# Patient Record
Sex: Female | Born: 1950 | Race: White | Hispanic: No | Marital: Married | Smoking: Former smoker
Health system: Southern US, Community
[De-identification: ages and names within clinical notes are randomized; demographics above are authoritative.]

## PROBLEM LIST (undated history)

## (undated) DIAGNOSIS — R0602 Shortness of breath: Secondary | ICD-10-CM

## (undated) DIAGNOSIS — K2960 Other gastritis without bleeding: Secondary | ICD-10-CM

## (undated) DIAGNOSIS — L03317 Cellulitis of buttock: Secondary | ICD-10-CM

## (undated) DIAGNOSIS — E039 Hypothyroidism, unspecified: Secondary | ICD-10-CM

## (undated) DIAGNOSIS — R000 Tachycardia, unspecified: Secondary | ICD-10-CM

## (undated) DIAGNOSIS — I499 Cardiac arrhythmia, unspecified: Secondary | ICD-10-CM

## (undated) DIAGNOSIS — I272 Pulmonary hypertension, unspecified: Secondary | ICD-10-CM

## (undated) DIAGNOSIS — J449 Chronic obstructive pulmonary disease, unspecified: Secondary | ICD-10-CM

## (undated) DIAGNOSIS — J154 Pneumonia due to other streptococci: Secondary | ICD-10-CM

## (undated) DIAGNOSIS — R0789 Other chest pain: Secondary | ICD-10-CM

## (undated) DIAGNOSIS — I739 Peripheral vascular disease, unspecified: Secondary | ICD-10-CM

## (undated) HISTORY — DX: Pneumonia due to other streptococci: J15.4

## (undated) HISTORY — PX: PARTIAL HYSTERECTOMY: SHX80

## (undated) HISTORY — PX: THUMB ARTHROSCOPY: SHX2509

## (undated) HISTORY — DX: Cellulitis of buttock: L03.317

## (undated) HISTORY — DX: Tachycardia, unspecified: R00.0

## (undated) HISTORY — DX: Other gastritis without bleeding: K29.60

## (undated) HISTORY — PX: CHOLECYSTECTOMY: SHX55

## (undated) HISTORY — DX: Other chest pain: R07.89

## (undated) HISTORY — DX: Pulmonary hypertension, unspecified: I27.20

## (undated) HISTORY — PX: APPENDECTOMY: SHX54

## (undated) HISTORY — DX: Peripheral vascular disease, unspecified: I73.9

---

## 2005-01-17 ENCOUNTER — Ambulatory Visit: Payer: Self-pay | Admitting: Internal Medicine

## 2005-01-17 ENCOUNTER — Ambulatory Visit (HOSPITAL_COMMUNITY): Admission: RE | Admit: 2005-01-17 | Discharge: 2005-01-17 | Payer: Self-pay | Admitting: Internal Medicine

## 2005-01-17 ENCOUNTER — Encounter: Payer: Self-pay | Admitting: Internal Medicine

## 2005-02-09 ENCOUNTER — Encounter: Admission: RE | Admit: 2005-02-09 | Discharge: 2005-02-09 | Payer: Self-pay | Admitting: Rheumatology

## 2005-02-21 ENCOUNTER — Ambulatory Visit (HOSPITAL_COMMUNITY): Admission: RE | Admit: 2005-02-21 | Discharge: 2005-02-21 | Payer: Self-pay | Admitting: Internal Medicine

## 2006-08-19 ENCOUNTER — Encounter: Payer: Self-pay | Admitting: Cardiology

## 2007-10-23 ENCOUNTER — Encounter: Payer: Self-pay | Admitting: Cardiology

## 2007-10-31 ENCOUNTER — Ambulatory Visit: Payer: Self-pay | Admitting: Cardiology

## 2007-11-06 ENCOUNTER — Ambulatory Visit: Payer: Self-pay | Admitting: Cardiology

## 2007-11-24 ENCOUNTER — Encounter: Admission: RE | Admit: 2007-11-24 | Discharge: 2007-11-24 | Payer: Self-pay | Admitting: Sports Medicine

## 2007-12-02 ENCOUNTER — Ambulatory Visit: Payer: Self-pay | Admitting: Cardiology

## 2007-12-08 ENCOUNTER — Ambulatory Visit: Payer: Self-pay | Admitting: Cardiology

## 2007-12-16 ENCOUNTER — Encounter: Payer: Self-pay | Admitting: Cardiology

## 2008-01-05 ENCOUNTER — Ambulatory Visit: Payer: Self-pay | Admitting: Cardiology

## 2008-10-06 ENCOUNTER — Encounter: Payer: Self-pay | Admitting: Cardiology

## 2008-10-07 ENCOUNTER — Encounter: Payer: Self-pay | Admitting: Cardiology

## 2008-10-08 ENCOUNTER — Encounter: Payer: Self-pay | Admitting: Cardiology

## 2008-10-14 ENCOUNTER — Encounter: Payer: Self-pay | Admitting: Cardiology

## 2008-11-12 ENCOUNTER — Encounter: Payer: Self-pay | Admitting: Cardiology

## 2008-11-24 ENCOUNTER — Encounter: Payer: Self-pay | Admitting: Physician Assistant

## 2008-11-24 ENCOUNTER — Ambulatory Visit: Payer: Self-pay | Admitting: Cardiology

## 2008-11-30 ENCOUNTER — Encounter: Payer: Self-pay | Admitting: Physician Assistant

## 2008-12-24 ENCOUNTER — Encounter (INDEPENDENT_AMBULATORY_CARE_PROVIDER_SITE_OTHER): Payer: Self-pay | Admitting: *Deleted

## 2009-02-02 DIAGNOSIS — R079 Chest pain, unspecified: Secondary | ICD-10-CM

## 2009-02-02 DIAGNOSIS — I959 Hypotension, unspecified: Secondary | ICD-10-CM

## 2009-02-02 DIAGNOSIS — R0989 Other specified symptoms and signs involving the circulatory and respiratory systems: Secondary | ICD-10-CM | POA: Insufficient documentation

## 2009-04-07 ENCOUNTER — Encounter: Payer: Self-pay | Admitting: Physician Assistant

## 2009-04-07 ENCOUNTER — Encounter: Payer: Self-pay | Admitting: Cardiology

## 2009-04-07 ENCOUNTER — Ambulatory Visit: Payer: Self-pay | Admitting: Cardiology

## 2009-04-08 ENCOUNTER — Encounter: Payer: Self-pay | Admitting: Cardiology

## 2009-04-09 ENCOUNTER — Encounter: Payer: Self-pay | Admitting: Cardiology

## 2009-05-12 ENCOUNTER — Encounter: Payer: Self-pay | Admitting: Cardiology

## 2009-05-18 ENCOUNTER — Ambulatory Visit: Payer: Self-pay | Admitting: Cardiology

## 2010-02-08 ENCOUNTER — Encounter (INDEPENDENT_AMBULATORY_CARE_PROVIDER_SITE_OTHER): Payer: Self-pay | Admitting: *Deleted

## 2010-05-23 NOTE — Letter (Signed)
Summary: [facility] D/C DR. VYAS  [facility] D/C DR. VYAS   Imported By: Zachary George 05/17/2009 14:48:39  _____________________________________________________________________  External Attachment:    Type:   Image     Comment:   External Document

## 2010-05-23 NOTE — Letter (Signed)
Summary: Recall, Screening Colonoscopy Only  [facility]  [address]   [place], [place] [address]   Phone: [phone]  Fax: [phone]    February 08, 2010  [facility] [address] [place], [place]  [address] 06-27-1950   Dear Ms. Prestia,   Our records indicate it is time to schedule your colonoscopy.    Please call our office at [phone] and ask for the nurse.   Thank you,    Hendricks Limes, LPN Cloria Spring, LPN  [facility] Ph: [phone]   Fax: [phone]

## 2010-05-23 NOTE — Assessment & Plan Note (Signed)
Summary: 6 month fu-recv letter vs   Visit Type:  Follow-up Primary Provider:  Doreen Beam, MD   History of Present Illness: The patient is seen for followup of tingling in her arms and tachycardia.  She was recently hospitalized and we saw her in consultation.  It was felt that her discomfort was not typical for cardiac pain.  She has severe COPD.  It was felt that a 2-D echo should be done.  The echo shows ejection fraction exceeding 65%.  She is now seen back for follow.  Preventive Screening-Counseling & Management  Alcohol-Tobacco     Smoking Status: quit > 6 months     Year Quit: 2010  Comments: Pt smoked off/on since about 60 yrs old. She quit in June 2010.  Current Medications (verified): 1)  Toprol Xl 25 Mg Xr24h-Tab (Metoprolol Succinate) .... Take 1 Tablet By Mouth Once A Day 2)  Cymbalta 60 Mg Cpep (Duloxetine Hcl) .... Take 1 Tablet By Mouth Once A Day 3)  Omeprazole 20 Mg Cpdr (Omeprazole) .... Take 1 Tablet By Mouth Once A Day 4)  Spiriva Handihaler 18 Mcg Caps (Tiotropium Bromide Monohydrate) .... Inhale As Directed Once Daily 5)  Advair Diskus 250-50 Mcg/dose Aepb (Fluticasone-Salmeterol) .... Inhale Two Times A Day As Directed 6)  Singulair 10 Mg Tabs (Montelukast Sodium) .... Take 1 Tablet By Mouth Once A Day 7)  Duoneb 0.5-2.5 (3) Mg/23ml Soln (Ipratropium-Albuterol) .... As Needed 8)  Xanax 0.5 Mg Tabs (Alprazolam) .... Take As Directed As Needed 9)  Vitamin D3 1000 Unit Caps (Cholecalciferol) .... Take 1 Capsule By Mouth Once A Day 10)  B Complex-B12  Tabs (B Complex Vitamins) .... Take 1 Tablet By Mouth Once A Day  Allergies: 1)  Amoxicillin (Amoxicillin) 2)  Allegra (Fexofenadine Hcl) 3)  Betadine (Povidone-Iodine) 4)  Cipro (Ciprofloxacin) 5)  Claritin (Loratadine) 6)  Darvocet-N 100 (Propoxyphene N-Apap) 7)  Sulfamethoxazole (Sulfamethoxazole) 8)  Zyrtec Childrens Hives Relief (Cetirizine Hcl) 9)  Percocet  Comments:  Nurse/Medical Assistant: The  patient's medications were reviewed with the patient and were updated in the Medication List. Pt verbally confirmed medications.  Cyril Loosen, RN, BSN (May 18, 2009 1:27 PM)  Past History:  Past Medical History: Last updated: 05/18/2009 Sinus tachycardia, persistent ... history of negative Holter monitor normal LVF atypical chest pain ... low risk pharmacologic perfusion imaging study, 7/09 COPD, severe ... on supplemental oxygen ... history of tobacco relative hypotension Intermittent claudication Erosive gastritis ... EGD, 6/10 Streptococcal pneumonia Cellulitis, right buttocks Pulmonary hypertension, mild  Social History: Smoking Status:  quit > 6 months  Review of Systems       Patient denies fever, chills, headache, sweats, rash, change in vision, change in hearing, chest pain, cough, nausea vomiting, urinary symptoms.  All other systems are reviewed and are negative.  Vital Signs:  Patient profile:   60 year old female Height:      63 inches Weight:      131.75 pounds BMI:     23.42 Pulse rate:   116 / minute BP sitting:   118 / 73  (left arm) Cuff size:   regular  Vitals Entered By: Cyril Loosen, RN, BSN (May 18, 2009 1:10 PM)  Comments Pt states she saw Dr. Sherril Croon last week for increased heart rate. She said Dr. Sherril Croon seems to think that is a common thing for her.    Physical Exam  General:  patient is wearing her oxygen today. Eyes:  no xanthelasma. Neck:  no  jugular venous distention. Lungs:  lungs reveal distant breath sounds. Heart:  cardiac exam reveals S1-S2.  She does have a persistent mild sinus tachycardia. Abdomen:  abdomen is soft. Extremities:  no peripheral edema. Psych:  patient is oriented to person time and place.  Affect is normal.   Impression & Recommendations:  Problem # 1:  TACHYCARDIA (ICD-785) The patient has persistent sinus tachycardia.  This is related to her pulmonary medications and her overall status.  She is  not a candidate for beta blocker.  No further workup.  Problem # 2:  CHEST PAIN-UNSPECIFIED (ICD-786.50)  Her updated medication list for this problem includes:    Toprol Xl 25 Mg Xr24h-tab (Metoprolol succinate) .Marland Kitchen... Take 1 tablet by mouth once a day  Orders: EKG w/ Interpretation (93000) EKG today reveals mild sinus tachycardia with no acute changes.  EKG is reviewed by me.  I believe that her arm tingling and discomfort into her neck is not cardiac in origin.  Consideration can be given to neurology evaluation.  Cardiology followup in one year.  Patient Instructions: 1)  Your physician recommends that you continue on your current medications as directed. Please refer to the Current Medication list given to you today. 2)  Your physician wants you to follow-up in: 1 year. You will receive a reminder letter in the mail about two months in advance. If you don't receive a letter, please call our office to schedule the follow-up appointment.

## 2010-05-23 NOTE — Consult Note (Signed)
Summary: CARDIOLOGY CONSULT/ [facility]  CARDIOLOGY CONSULT/ [facility]   Imported By: Zachary George 05/17/2009 14:45:03  _____________________________________________________________________  External Attachment:    Type:   Image     Comment:   External Document

## 2010-05-23 NOTE — Procedures (Signed)
Summary: Holter and Event/ [facility] PATIENT INFO  Holter and Event/ [facility] PATIENT INFO   Imported By: Dorise Hiss 05/17/2009 10:29:46  _____________________________________________________________________  External Attachment:    Type:   Image     Comment:   External Document

## 2010-05-23 NOTE — Consult Note (Signed)
Summary: PULMONARY CONSULT DR. Cherie Ouch  PULMONARY CONSULT DR. Cherie Ouch   Imported By: Zachary George 05/17/2009 14:49:16  _____________________________________________________________________  External Attachment:    Type:   Image     Comment:   External Document

## 2010-09-05 NOTE — Assessment & Plan Note (Signed)
[facility]                          [facility] NOTE   Lauren Mclaughlin, Lauren Mclaughlin                     MRN:          [id number]  DATE:10/31/2007                            DOB:          [birth year]    REFERRING PHYSICIAN:  Doreen Beam, MD   REASON FOR REFERRAL:  Chest pain.   HISTORY OF PRESENT ILLNESS:  Lauren Mclaughlin is a 60 year old female  patient who is referred to Dr. Myrtis Ser at the request of Dr. Sherril Croon secondary  to chest pain.  The patient has no history of coronary disease.  She  denies any history of hypertension, diabetes mellitus, or  hyperlipidemia.  She tells me that she apparently saw Dr. Shelva Majestic about  7-9 years ago and underwent an echocardiogram and stress test.  She  reports that these were both normal.  She has not had any type of  cardiology followup since that time.   Over the last month, the patient has noted some chest discomfort.  This  is substernal and she describes it as a tightness.  It comes on at rest,  and lasts a few seconds, and goes away on its own.  She denies any  aggravating factors.  She does note that she takes Gas-X at times which  helps.  She woke up on few occasions with these symptoms.  She denies  associated shortness of breath, nausea, or diaphoresis.  She does note  some shortness of breath with exertion.  She has had this for about the  last year.  There has been no change.  She notes NYHA class II-III  symptoms.  Of note, she does have fairly significant COPD and continues  to smoke cigarettes.   The patient recently did go to the emergency room secondary to swelling  in her anterior neck.  She did have a CT scan of her neck that revealed  a small disk protrusion at the C5-C6 level with a small amount of  extruded disk material extending caudally.   PAST MEDICAL HISTORY:  1. Anxiety.  2. COPD.      a.     PFTs in April 2008, revealed severe COPD with GOLD stage IV       with decrease in DLCO  suggesting the presence of anatomic       emphysema  3. Allergic rhinitis.      a.     She has a consult pending with an allergist at [facility].  4. 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 Benson Setting some years ago and apparently had an MRI.  5. Stress incontinence.  6. Migraine headaches.   ALLERGIES:  CIPRO, ALLEGRA, ZYRTEC, CLARITIN, DARVOCET, PENICILLIN,  SULFA.   MEDICATIONS:  1. Cymbalta 60 mg daily.  2. Spiriva 18 mcg 2 puffs a day.  3. Astelin nasal spray.  4. Singular 10 mg q.h.s.  5. Some type of bladder pill.  6. Xanax 0.5 mg 1 in the morning, 2 in the evening.  7. Multivitamin daily.  8. Calcium daily.  9. Xanax  p.r.n.  10.Aleve p.r.n.   SOCIAL HISTORY:  The patient smokes a pack of cigarettes per day and has  done so for the last 35 years.  She denies alcohol or drug abuse.  She  is married, has 2 children.  She is a housewife.   FAMILY HISTORY:  Insignificant for premature CAD.  Her mother died of  lung cancer and also had peripheral arterial disease.  Her father died  from a stroke at age 31 and he also had colon cancer.   REVIEW OF SYSTEMS:  Please see HPI.  She denies orthopnea, PND, or pedal  edema.  She denies recent syncope.  She did have an episode of post-  micturition syncope about 6 months go apparently reported by her  husband.  She did not seek followup.  She denies near syncope.  She  denies fevers, chills, cough.  She has had a sore throat.  She denies  melena, hematochezia, hematuria, dysuria.  Denies any skin or hair  changes.  Denies dysphagia or odynophagia.  Rest of review of systems  are negative.   PHYSICAL EXAMINATION:  GENERAL:  She is a well-nourished, well-developed  female, in no acute distress.  VITAL SIGNS:  Blood pressure is 129/81, pulse 133, weight 128.4 pounds.  HEENT:  Normal.  NECK:  Without JVD.  She does have some mild-to-moderate edema noted at  the base of her neck just above her  manubrium that feels cystic in  nature to palpation.  LYMPH:  Without lymphadenopathy.  ENDOCRINE:  Without thyromegaly.  CARDIAC:  Normal S1, S2, regular rate and rhythm.  No murmurs, clicks,  rubs, or gallops.  LUNGS:  Clear to auscultation bilaterally.  ABDOMEN:  Soft, nontender with normoactive bowel sounds.  No  organomegaly.  EXTREMITIES:  Without edema.  NEUROLOGIC:  He is alert and oriented x3.  Cranial nerve II through XII  grossly intact.  VASCULAR EXAM:  No carotid bruits noted bilaterally.  Dorsalis pedis and  posterior tibialis pulses are difficult to palpate bilaterally.  SKIN:  Warm and dry.   ELECTROCARDIOGRAM:  Reveals sinus tachycardia with a heart rate of 123,  normal axis, biatrial enlargement, poor r-wave progression, no ischemic  changes.   IMPRESSION:  1. Chest pain.  2. Dyspnea with exertion.  3. Chronic obstructive pulmonary disease.  4. Anxiety.  5. Degenerative disk disease.  6. Anterior neck swelling.  7. Sinus tachycardia.   PLAN:  1. Lauren Mclaughlin presents to the office today for further elevation of      chest pain.  She also has shortness of breath with exertion.  She      continues to smoke cigarettes and has significant COPD.  Her      shortness of breath can certainly be explained by her smoking      history and COPD.  However, she does present with sinus      tachycardia.  She notes that her heart rate has been elevated in      the past.  A tachycardia-induced cardiomyopathy should be ruled      out.  She has few risk factors for coronary disease, but does      continue to smoke cigarettes and is over 50.  Therefore, coronary      disease needs to be ruled out.  2. A low-level adenosine stress Cardiolite will be ordered to rule out      ischemic heart disease.  3. A 2-D echocardiogram will be ordered to assess her  LV function.  4. We will try to obtain labs from her gynecologist who recently drew      a TSH in April 2009.  Of note, her  labs when she was in the      emergency room for her neck pain revealed a creatinine of 0.8,      potassium of 4, hemoglobin of 14.5.  5. The patient will return for followup with Dr. Myrtis Ser in the next 3-4      weeks to review the above testing.  If the above testing is normal      and she remains tachycardiac, we can certainly consider adding low-      dose beta-blocker in the future to better control her heart rate.      Tereso Newcomer, PA-C  Electronically Signed      Luis Abed, MD, [facility]  Electronically Signed   SW/MedQ  DD: 10/31/2007  DT: 11/01/2007  Job #: [id number]   cc:   Doreen Beam, MD

## 2010-09-05 NOTE — Assessment & Plan Note (Signed)
[facility]                          [facility] NOTE   MARITA, BURNSED                     MRN:          [id number]  DATE:01/05/2008                            DOB:          02/24/1951    Ms. Lauren Mclaughlin is here for followup.  When she was here last, we arranged  for a 48-hour Holter monitor.  The study revealed that she has sinus  tachycardia.  Her mean heart rate was 94.  She had no evidence of  significant other arrhythmias.  She had some PACs and PVCs.  With  persistent sinus and mild sinus tachycardia, she was placed on 25 mg of  long-acting metoprolol.  She feels well with this.  It definitely slows  her heart rate.  She says that she may feel slight fatigue at times, but  she is adjusting the dosing of her beta-blocker.   PAST MEDICAL HISTORY:   ALLERGIES:  See the medication flow sheet.   MEDICATIONS:  1. Cymbalta.  2. Spiriva.  3. Astelin nasal.  4. Singulair, a pill for her bladder.  5. Xanax.  6. Metoprolol XL 25.   OTHER MEDICAL PROBLEMS:  See the list below.   REVIEW OF SYSTEMS:  She mentions the sensation of swelling in her  suprasternal notch into the right neck.  She says that she has seen ENT  and other doctors about this.  The etiology is not clear and it is not  clear to me.   Otherwise, review of systems is negative.   PHYSICAL EXAMINATION:  VITAL SIGNS:  Blood pressure is 139/87 with a  pulse of 83.  Her weight is 129 pounds.  GENERAL:  The patient is oriented to person, time and place.  Affect is  normal.  HEENT:  There is slight fullness in the suprasternal notch into the  right neck.  It is soft.  It is not pulsatile.  Etiology is not clear.  LUNGS:  Clear.  Respiratory effort is not labored.  CARDIAC:  An S1 with an S2.  There are no clicks or significant murmurs.  ABDOMEN:  Soft.  EXTREMITIES:  She has no peripheral edema.   PROBLEMS:  1. History of chest discomfort.  We believe that there is  no marked      ischemia.  2. Normal left ventricular function by echocardiogram.  3. Mild pulmonary hypertension.  4. Shortness of breath probably related to her pulmonary disease.  5. Severe chronic obstructive pulmonary disease.  6. Longstanding tobacco.  7. Persistent mild sinus tachycardia.  She feels somewhat better on      low-dose beta-blockade.  We will use selective beta-blockade and      she is stable with this.  8. Sensation of fullness in the suprasternal notch.  Etiology is not      clear.  It is not cardiac.   Her cardiac status is stable.  I will see her back for cardiology  followup in 6 months.     Luis Abed, MD, [facility]  Electronically Signed    JDK/MedQ  DD: 01/05/2008  DT: 01/06/2008  Job #:  [id number]   cc:   Doreen Beam, MD

## 2010-09-05 NOTE — Assessment & Plan Note (Signed)
[facility]                          [facility] NOTE   WHITTLEY, CARANDANG                     MRN:          [id number]  DATE:12/02/2007                            DOB:          May 21, 1950    PRIMARY CARDIOLOGIST:  Luis Abed, MD, [facility]   REASON FOR VISIT:  Scheduled followup.  Please refer to initial  consultation note of October 31, 2007, for full details.   The patient was referred to [place] for evaluation of chest pain, with no  prior history of heart disease.  She also presented with significant  exertional dyspnea and known history of severe COPD, by prior pulmonary  function testing.  She also presented with history of persistent sinus  tachycardia.   Recommendations included noninvasive workup with a 2-D echo and a low-  level adenosine stress test.  Echo showed normal LVF (EF 60%) with no  focal wall motion abnormalities.  There was suggestion of mild pulmonary  hypertension.   Adenosine stress Cardiolite was interpreted as a low risk study, by Dr.  Myrtis Ser, with no definite evidence of reversibility.  There was, however,  question of possible very small scar at the apical aspect of the septum;  EF 51%.   Clinically, Ms. Harkins has not had any chest pain since her last  visit.   With respect to her persistent sinus tachycardia, she informs me today  that this has been a longstanding problem of some 20-30 years.  She has  never worn a cardiac monitor and several thyroid levels in the recent  past have shown normal TSH levels.   Unfortunately, Ms. Karen continues to smoke, but states that that is  the only bad thing that she does.   MEDICATIONS:  Unchanged from recent visit.   PHYSICAL EXAMINATION:  Vital Signs:  Blood pressure 129/83, pulse 120  and regular, and weight 130.  GENERAL:  A 60 year old female, sitting upright, no distress.  HEENT:  Normocephalic and atraumatic.  NECK:  Palpable carotid pulse without  bruits; no JVD.  LUNGS:  Clear to auscultation all fields.  Diminished breath sounds in  bases, with faint late crackles in the left lower lung.  No wheezes or  rhonchi.  HEART:  RRR (S1S2).  No significant murmurs.  No rubs.  ABDOMEN:  Soft, nontender.  EXTREMITIES:  No pedal edema.  NEURO:  Mildly anxious, but no focal deficit.   IMPRESSION:  1. Atypical chest pain.      a.     Recent low-risk adenosine stress Cardiolite.  2. Exertional dyspnea.      a.     Normal LVF by 2-D echocardiogram and stress testing.  3. Severe COPD.      a.     Longstanding tobacco.  4. Longstanding sinus tachycardia.   PLAN:  1. Schedule 48-hour Holter monitoring for further clarification of      resting heart rate.  Further recommendations, including possible      medical management, will be made pending review of her monitor      results.  2. Schedule return clinic followup with myself and  Dr. Myrtis Ser in 1      month.      Rozell Searing, PA-C  Electronically Signed      Luis Abed, MD, [facility]  Electronically Signed   GS/MedQ  DD: 12/02/2007  DT: 12/03/2007  Job #: [id number]   cc:   Doreen Beam, MD

## 2010-09-05 NOTE — Assessment & Plan Note (Signed)
[facility]                          [facility] NOTE   JAQUELINE, UBER                     MRN:          [id number]  DATE:11/24/2008                            DOB:          08/02/1950    PRIMARY CARDIOLOGIST:  Luis Abed, MD, [facility]   REASON FOR VISIT:  Six month followup.   Ms. Butterbaugh returns to our clinic for ongoing monitoring of atypical  chest pain and persistent sinus tachycardia.  She completed an extensive  noninvasive workup, which was negative for any definite evidence of  ischemia.  She has normal ventricular function, and mild pulmonary  hypertension.   Notably, Ms. Hanna has severe COPD and is on supplemental oxygen.  Following a recent hospitalization here at [facility], with COPD  exacerbation and possible pneumonia, she has since quit smoking tobacco.   From a cardiac perspective, she denies any exertional angina pectoris,  but is somewhat limited in her mobility.  She does have chronic  exertional dyspnea.  She also has a sensation of her heart persistently  being elevated.   REVIEW OF SYSTEMS:  The patient refers to possible claudication of the  right lower extremity, originating in the right buttock, and down in the  lateral aspect of the right thigh.  She has no significant symptoms on  the left.  All other systems reviewed, and are negative.   MEDICATIONS:  1. Toprol-XL 25 daily.  2. Albuterol/Atrovent nebulizer every 4-6 hours.  3. Omeprazole 20 b.i.d.  4. Advair 2 puffs b.i.d.  5. Xanax 0.5 b.i.d.  6. Singulair 10 daily.  7. Cymbalta 60 daily.  8. Spiriva daily.   PHYSICAL EXAMINATION:  VITAL SIGNS:  Blood pressure initially is 89/65,  108/70, per repeat.  Pulse 113.  Sats 98% on 2 L.  Weight 133.  GENERAL:  A 60 year old female, sitting upright, in no distress.  HEENT:  Normocephalic and atraumatic.  NECK:  Palpable carotid pulses without bruits; no JVD.  LUNGS:  Diminished breath sounds  at bases, but without crackles or  wheezes.  HEART:  Regular rhythm at increased rate.  No significant murmurs.  No  rubs.  ABDOMEN:  Soft, nontender.  EXTREMITIES:  Faintly palpable peripheral pulses and (audible bilateral  PT and DP, by a Doppler).  NEURO:  Mildly anxious.   IMPRESSION:  1. Persistent sinus tachycardia.  1a.  Low-dose Toprol added.  1b.  No evidence of dysrhythmia by previous Holter monitoring.  1. Normal LVF.  2. Atypical chest pain.  3a.  Low-risk pharmacologic perfusion imaging study, July 2009.  1. Severe COPD.  4a.  On supplemental oxygen.  1. Tobacco, recently discontinued.  2. Relative hypotension.  3. Intermittent claudication.   PLAN:  1. Continue current medication regimen.  We are unable to further up      titrate beta-blocker for management of persistent sinus      tachycardia, in light of relative hypotension.  We do, however,      recommend the patient remain on this very low-dose of long-acting      beta-blocker, to help modulate her tachycardia.  2. Schedule  lower extremity arterial Dopplers (segmental), to rule out      occlusive disease.  If these are negative, the patient will be      reassured and may want to proceed with further evaluation for      possible musculoskeletal etiology of her discomfort.  3. Schedule return clinic followup with myself and Dr. Myrtis Ser in 6      months.      Rozell Searing, PA-C  Electronically Signed      Luis Abed, MD, [facility]  Electronically Signed   GS/MedQ  DD: 11/24/2008  DT: 11/25/2008  Job #: [id number]   cc:   Doreen Beam, MD

## 2010-09-08 NOTE — Op Note (Signed)
Lauren Mclaughlin, Lauren Mclaughlin            ACCOUNT NO.:  [id number]   MEDICAL RECORD NO.:  [id number]          PATIENT TYPE:  AMB   LOCATION:  DAY                           FACILITY:  [facility]   PHYSICIAN:  R. Roetta Sessions, M.D. DATE OF BIRTH:  1950/06/15   DATE OF PROCEDURE:  01/17/2005  DATE OF DISCHARGE:                                 OPERATIVE REPORT   PROCEDURE:  Colonoscopy with biopsy.   INDICATIONS FOR PROCEDURE:  The patient is a 60 year old lady who comes for  screening colonoscopy. She is devoid of any lower GI tract symptoms. No  family history of colorectal neoplasia. She has never had a colonoscopy  previously. Colonoscopy is now being done as a screening maneuver. Potential  risks, benefits, and alternatives have been reviewed.   PROCEDURE NOTE:  O2 saturation, blood pressure, pulse, and respirations were  monitored throughout the entire procedure. Conscious sedation with Versed 4  mg IV and Demerol 100 mg IV in divided doses. SB prophylaxis with vancomycin  1 g IV, gentamicin 85 mg IV prior to the procedure.   INSTRUMENT:  Olympus video chip system.   FINDINGS:  Digital rectal exam revealed no abnormalities.   ENDOSCOPIC FINDINGS:  Prep was good.   Rectum:  Rectal vault was small. I was unable to retroflex. For the same  reason, I got a good look at the rectal mucosa. She had internal and  external hemorrhoids. She also had three 4-mm diminutive polyps clustered at  15 cm.   Colon:  Colonic mucosa was surveyed from the rectosigmoid junction through  the left, transverse, and right colon to the area of the appendiceal  orifice, ileocecal valve, and cecum. These structures were well seen and  photographed for the record. From this level, the scope was slowly  withdrawn, and all previously mentioned mucosal surfaces were again seen.  The colonic mucosa appeared normal. The three diminutive polyps at 15 cm  were cold biopsied/removed. The patient tolerated the procedure well  and was  reactive to endoscopy.   IMPRESSION:  Internal and external hemorrhoids. Diminutive polyps at 15 cm  (rectosigmoid) removed with cold biopsy forceps. Remainder of colonic mucosa  appeared normal.   RECOMMENDATIONS:  1.  Followup on pathology.  2.  Further recommendations to follow.      Jonathon Bellows, M.D.  Electronically Signed     RMR/MEDQ  D:  01/17/2005  T:  01/17/2005  Job:  [id number]   cc:   Doreen Beam  Fax: [phone]   Ruthy Dick  Fax: [phone]

## 2010-10-26 ENCOUNTER — Encounter: Payer: Self-pay | Admitting: Cardiology

## 2011-06-28 ENCOUNTER — Encounter (INDEPENDENT_AMBULATORY_CARE_PROVIDER_SITE_OTHER): Payer: Self-pay | Admitting: *Deleted

## 2011-07-16 ENCOUNTER — Telehealth (INDEPENDENT_AMBULATORY_CARE_PROVIDER_SITE_OTHER): Payer: Self-pay | Admitting: *Deleted

## 2011-07-16 ENCOUNTER — Encounter (INDEPENDENT_AMBULATORY_CARE_PROVIDER_SITE_OTHER): Payer: Self-pay | Admitting: Internal Medicine

## 2011-07-16 ENCOUNTER — Ambulatory Visit (INDEPENDENT_AMBULATORY_CARE_PROVIDER_SITE_OTHER): Admitting: Internal Medicine

## 2011-07-16 ENCOUNTER — Other Ambulatory Visit (INDEPENDENT_AMBULATORY_CARE_PROVIDER_SITE_OTHER): Payer: Self-pay | Admitting: *Deleted

## 2011-07-16 VITALS — BP 88/54 | HR 72 | Temp 97.7°F | Ht 63.0 in | Wt 105.0 lb

## 2011-07-16 DIAGNOSIS — K625 Hemorrhage of anus and rectum: Secondary | ICD-10-CM | POA: Insufficient documentation

## 2011-07-16 MED ORDER — PEG-KCL-NACL-NASULF-NA ASC-C 100 G PO SOLR: 1.0000 | Freq: Once | ORAL | Status: DC

## 2011-07-16 NOTE — Telephone Encounter (Signed)
Patient needs movi prep 

## 2011-07-16 NOTE — Progress Notes (Addendum)
Subjective:     Patient ID: Lauren Mclaughlin, female   DOB: 1951-01-08, 61 y.o.   MRN: [id number]  HPI  Lauren Mclaughlin is a 61 yr old female referred to our office for rectal bleeding x 2 days the first of this month. I .  She was having diarrhea with the rectal bleeding.  There was no pain associated with her symptoms.   She also has weight loss. In August of 2011 her weight was 128. Today her weight is 105.   She usually has a BM about x 4- a day. Sometimes she will even have 8 stools.Some of her stools are formed. She has not tried Imodium or Fiber pills. Appetite is good. No abdominal pain.  Occasionally has an abdominal cramp.  Family hx of colon cancer. (father age 30). In 2011 she underwent a sigmoidoscopy for bloody diarrhea. Consistent with ischemic colitis. 07/17/2009 CT abdomen/pelvis with CM for abdominal pain and bloody diarrhea: revealed segmental colitis involving a cm long segment of the descending colon.  Differential considerations include inflammatory bowel colitis such as Crohbn's disease, infectious colitis and ischemic colitis.  2006 Colonoscopy Dr. Jena Gauss:  Screening: Internal and external hemorrhoids. Diminutive polyps at 15cm (rectosigmoid) removed with cold biopsy forceps. Biopsy: Inflamed hyperplastic polyps. No tumor seen.  Review of Systemssee hpi Current Outpatient Prescriptions  Medication Sig Dispense Refill  . ALPRAZolam (XANAX) 0.5 MG tablet Take 0.5 mg by mouth as directed.        . B Complex Vitamins (B COMPLEX-B12) TABS Take 1 tablet by mouth daily.        . Cholecalciferol (VITAMIN D3) 1000 UNITS CAPS Take 1 capsule by mouth daily.        . DULoxetine (CYMBALTA) 60 MG capsule Take 60 mg by mouth daily.        . Fluticasone-Salmeterol (ADVAIR) 250-50 MCG/DOSE AEPB Inhale 1 puff into the lungs every 12 (twelve) hours.        Marland Kitchen ipratropium-albuterol (DUONEB) 0.5-2.5 (3) MG/3ML SOLN Take 3 mLs by nebulization every 4 (four) hours as needed.        . metoprolol succinate  (TOPROL-XL) 25 MG 24 hr tablet Take 25 mg by mouth daily.        . montelukast (SINGULAIR) 10 MG tablet Take 10 mg by mouth at bedtime.        Marland Kitchen omeprazole (PRILOSEC) 20 MG capsule Take 20 mg by mouth daily.        Marland Kitchen tiotropium (SPIRIVA) 18 MCG inhalation capsule Place 18 mcg into inhaler and inhale daily.          Past Medical History  Diagnosis Date  . Sinus tachycardia     persistent. History of negative Holter monitor. Normal LVF   . Atypical chest pain     low risk pharmacologic imaging study, 7/09  . 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 history of tobacco  . Intermittent claudication   . Erosive gastritis     EGD 6/10  . Streptococcal pneumonia   . Cellulitis of buttock, right   . Pulmonary hypertension     mild   Past Surgical History  Procedure Date  . Total abdominal hysterectomy   . Appendectomy   . Cholecystectomy    No family status information on file.   History   Social History  . Marital Status: Married    Spouse Name: N/A    Number of Children: N/A  . Years of Education: N/A   Occupational History  .  Not on file.   Social History Main Topics  . Smoking status: Current Some Day Smoker  . Smokeless tobacco: Not on file  . Alcohol Use: No  . Drug Use: No  . Sexually Active: Not on file   Other Topics Concern  . Not on file   Social History Narrative  . No narrative on file   History   Social History  . Marital Status: Married    Spouse Name: N/A    Number of Children: N/A  . Years of Education: N/A   Occupational History  . Not on file.   Social History Main Topics  . Smoking status: Former Games developer  . Smokeless tobacco: Not on file   Comment: Quit 3 yrs ago  . Alcohol Use: No  . Drug Use: No  . Sexually Active: Not on file   Other Topics Concern  . Not on file   Social History Narrative  . No narrative on file   No family status information on file.   Allergies  Allergen Reactions  . Amoxicillin     REACTION: rash  .  Cetirizine Hcl     REACTION: unspecified  . Ciprofloxacin     REACTION: Joint Pain  . Fexofenadine     REACTION: unspecified  . Loratadine     REACTION: unspecified  . Oxycodone-Acetaminophen     REACTION: Itching, "goes nuts"  . Povidone-Iodine     REACTION: rash  . Propoxyphene N-Acetaminophen     REACTION: rash  . Sulfamethoxazole     REACTION: unspecified        Objective:   Physical Exam Filed Vitals:   07/16/11 1517  Height: 5\' 3"  (1.6 m)  Weight: 105 lb (47.628 kg)    Alert and oriented. Skin warm and dry. Oral mucosa is moist.   . Sclera anicteric, conjunctivae is pink. Thyroid not enlarged. No cervical lymphadenopathy. Lungs clear. Heart regular rate and rhythm.  Abdomen is soft. Bowel sounds are positive. No hepatomegaly. No abdominal masses felt. No tenderness.  No edema to lower extremities. Patient is alert and oriented.     Assessment:     Rectal bleeding resolved. This has now resolved. There was no pain associated with this.  Suspect possible hemorroidal bleed. I discussed this case with Dr. Karilyn Cota.    Plan:     Colonoscopy with Dr. Karilyn Cota

## 2011-07-16 NOTE — Patient Instructions (Signed)
Will schedule a colonoscopy with Dr. Rehman 

## 2011-07-16 NOTE — Progress Notes (Signed)
Addended by: Len Blalock on: 07/16/2011 04:41 PM   Modules accepted: Orders

## 2011-07-19 ENCOUNTER — Encounter (INDEPENDENT_AMBULATORY_CARE_PROVIDER_SITE_OTHER): Payer: Self-pay

## 2011-07-24 ENCOUNTER — Telehealth (INDEPENDENT_AMBULATORY_CARE_PROVIDER_SITE_OTHER): Payer: Self-pay | Admitting: *Deleted

## 2011-07-24 MED ORDER — SOD PHOS MONO-SOD PHOS DIBASIC 1.102-0.398 G PO TABS: 1.0000 | ORAL_TABLET | Freq: Once | ORAL | Status: DC

## 2011-07-24 NOTE — Telephone Encounter (Signed)
Patient needs osmo prep, she didn't want liquid because it makes her sick

## 2011-08-08 ENCOUNTER — Ambulatory Visit (HOSPITAL_COMMUNITY): Admission: RE | Admit: 2011-08-08 | Source: Ambulatory Visit | Admitting: Internal Medicine

## 2011-08-08 ENCOUNTER — Encounter (HOSPITAL_COMMUNITY): Admission: RE | Payer: Self-pay | Source: Ambulatory Visit

## 2011-08-08 SURGERY — COLONOSCOPY
Anesthesia: Moderate Sedation

## 2011-10-04 ENCOUNTER — Telehealth (INDEPENDENT_AMBULATORY_CARE_PROVIDER_SITE_OTHER): Payer: Self-pay | Admitting: *Deleted

## 2011-10-04 ENCOUNTER — Other Ambulatory Visit (INDEPENDENT_AMBULATORY_CARE_PROVIDER_SITE_OTHER): Payer: Self-pay | Admitting: *Deleted

## 2011-10-04 DIAGNOSIS — K625 Hemorrhage of anus and rectum: Secondary | ICD-10-CM

## 2011-10-04 DIAGNOSIS — R197 Diarrhea, unspecified: Secondary | ICD-10-CM

## 2011-10-04 DIAGNOSIS — Z800 Family history of malignant neoplasm of digestive organs: Secondary | ICD-10-CM

## 2011-10-04 NOTE — Telephone Encounter (Signed)
PCP/Requesting MD: vyas  Name & DOB: varnell donate July 28, 2050     Procedure: tcs  Reason/Indication:  Diarrhea, rectal bleeding, fam hx colon ca (pt was sch's in April & had to cancel due to sickness)   Has patient had this procedure before?  yes  If so, when, by whom and where?  2010  Is there a family history of colon cancer?  yes  Who?  What age when diagnosed?  father  Is patient diabetic?   no      Does patient have prosthetic heart valve?  no  Do you have a pacemaker?  no  Has patient had joint replacement within last 12 months?  no  Is patient on Coumadin, Plavix and/or Aspirin? no  Medications: see EPCI  Allergies: see EPIC  Medication Adjustment:   Procedure date & time: 10/24/10 at 200

## 2011-10-12 ENCOUNTER — Encounter (HOSPITAL_COMMUNITY): Payer: Self-pay | Admitting: Pharmacy Technician

## 2011-10-24 ENCOUNTER — Ambulatory Visit (HOSPITAL_COMMUNITY)
Admission: RE | Admit: 2011-10-24 | Discharge: 2011-10-24 | Disposition: A | Discharge status: Home / Self Care | Source: Ambulatory Visit | Attending: Internal Medicine | Admitting: Internal Medicine

## 2011-10-24 ENCOUNTER — Encounter (HOSPITAL_COMMUNITY): Payer: Self-pay | Admitting: *Deleted

## 2011-10-24 ENCOUNTER — Encounter (HOSPITAL_COMMUNITY)
Admission: RE | Disposition: A | Discharge status: Home / Self Care | Payer: Self-pay | Source: Ambulatory Visit | Attending: Internal Medicine

## 2011-10-24 DIAGNOSIS — R197 Diarrhea, unspecified: Secondary | ICD-10-CM

## 2011-10-24 DIAGNOSIS — D126 Benign neoplasm of colon, unspecified: Secondary | ICD-10-CM | POA: Insufficient documentation

## 2011-10-24 DIAGNOSIS — K625 Hemorrhage of anus and rectum: Secondary | ICD-10-CM

## 2011-10-24 DIAGNOSIS — D129 Benign neoplasm of anus and anal canal: Secondary | ICD-10-CM | POA: Insufficient documentation

## 2011-10-24 DIAGNOSIS — Z800 Family history of malignant neoplasm of digestive organs: Secondary | ICD-10-CM | POA: Insufficient documentation

## 2011-10-24 DIAGNOSIS — J4489 Other specified chronic obstructive pulmonary disease: Secondary | ICD-10-CM | POA: Insufficient documentation

## 2011-10-24 DIAGNOSIS — K644 Residual hemorrhoidal skin tags: Secondary | ICD-10-CM

## 2011-10-24 DIAGNOSIS — D128 Benign neoplasm of rectum: Secondary | ICD-10-CM | POA: Insufficient documentation

## 2011-10-24 DIAGNOSIS — I10 Essential (primary) hypertension: Secondary | ICD-10-CM | POA: Insufficient documentation

## 2011-10-24 DIAGNOSIS — Z79899 Other long term (current) drug therapy: Secondary | ICD-10-CM | POA: Insufficient documentation

## 2011-10-24 DIAGNOSIS — J449 Chronic obstructive pulmonary disease, unspecified: Secondary | ICD-10-CM | POA: Insufficient documentation

## 2011-10-24 DIAGNOSIS — K921 Melena: Secondary | ICD-10-CM | POA: Insufficient documentation

## 2011-10-24 HISTORY — PX: COLONOSCOPY: SHX5424

## 2011-10-24 HISTORY — DX: Shortness of breath: R06.02

## 2011-10-24 HISTORY — DX: Hypothyroidism, unspecified: E03.9

## 2011-10-24 HISTORY — DX: Cardiac arrhythmia, unspecified: I49.9

## 2011-10-24 HISTORY — DX: Chronic obstructive pulmonary disease, unspecified: J44.9

## 2011-10-24 SURGERY — COLONOSCOPY
Anesthesia: Moderate Sedation

## 2011-10-24 MED ORDER — MIDAZOLAM HCL 5 MG/5ML IJ SOLN
INTRAMUSCULAR | Status: AC
  Filled 2011-10-24: qty 5

## 2011-10-24 MED ORDER — STERILE WATER FOR IRRIGATION IR SOLN
Status: DC | PRN
  Administered 2011-10-24: 14:00:00

## 2011-10-24 MED ORDER — MEPERIDINE HCL 50 MG/ML IJ SOLN
INTRAMUSCULAR | Status: DC | PRN
  Administered 2011-10-24 (×2): 25 mg via INTRAVENOUS

## 2011-10-24 MED ORDER — MIDAZOLAM HCL 5 MG/5ML IJ SOLN
INTRAMUSCULAR | Status: AC
  Filled 2011-10-24: qty 10

## 2011-10-24 MED ORDER — MEPERIDINE HCL 50 MG/ML IJ SOLN
INTRAMUSCULAR | Status: AC
  Filled 2011-10-24: qty 2

## 2011-10-24 MED ORDER — LOPERAMIDE HCL 2 MG PO TABS: 2.0000 mg | ORAL_TABLET | Freq: Every day | ORAL | Status: AC

## 2011-10-24 MED ORDER — MIDAZOLAM HCL 5 MG/5ML IJ SOLN
INTRAMUSCULAR | Status: DC | PRN
  Administered 2011-10-24: 2 mg via INTRAVENOUS
  Administered 2011-10-24: 3 mg via INTRAVENOUS
  Administered 2011-10-24 (×5): 2 mg via INTRAVENOUS

## 2011-10-24 MED ORDER — SODIUM CHLORIDE 0.45 % IV SOLN
Freq: Once | INTRAVENOUS | Status: AC
  Administered 2011-10-24: 14:00:00 via INTRAVENOUS

## 2011-10-24 NOTE — H&P (Signed)
Niyla Marone is an 61 y.o. female.   Chief Complaint: She is here for colonoscopy. HPI: Patient is 61 year old Caucasian female who has chronic diarrhea. She was in the office few months ago after she appear episodes of rectal bleeding. She was scheduled for colonoscopy and had to be postponed she was hospitalized with pneumonia in April. She has anywhere from to 12 bowel movements per day. Occasionally she may get constipated. She says she has a good appetite he keeps losing weight. Her usual weight was 130 pounds and now she's 106. He also complains of chronic pain over right costal margin. Family history is positive for colon carcinoma in her father who had APR at  24 and died of stroke 7 years later.  Past Medical History  Diagnosis Date  . Sinus tachycardia     persistent. History of negative Holter monitor. Normal LVF   . Atypical chest pain     low risk pharmacologic imaging study, 7/09  . 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 history of tobacco  . Intermittent claudication   . Erosive gastritis     EGD 6/10  . Streptococcal pneumonia   . Cellulitis of buttock, right   . Pulmonary hypertension     mild  . Dysrhythmia   . Hypothyroidism   . Shortness of breath   . COPD (chronic obstructive pulmonary disease)     Past Surgical History  Procedure Date  . Appendectomy   . Cholecystectomy   . Partial hysterectomy   . Thumb arthroscopy     Family History  Problem Relation Age of Onset  . Cancer    . Stroke     Social History:  reports that she has quit smoking. She does not have any smokeless tobacco history on file. She reports that she does not drink alcohol or use illicit drugs.  Allergies:  Allergies  Allergen Reactions  . Amoxicillin     REACTION: rash  . Cetirizine Hcl     REACTION: unspecified  . Ciprofloxacin     REACTION: Joint Pain  . Fexofenadine     REACTION: unspecified  . Loratadine     REACTION: unspecified  . Other     Pt states she has  some other allergies but can not remember what they are at this time  . Oxycodone-Acetaminophen     REACTION: Itching, "goes nuts"  . Povidone     REACTION: rash  . Propoxyphene-Acetaminophen     REACTION: rash  . Sulfamethoxazole     REACTION: unspecified    Medications Prior to Admission  Medication Sig Dispense Refill  . ALPRAZolam (XANAX) 0.5 MG tablet Take 0.5 mg by mouth 3 (three) times daily as needed. For anxiety      . B Complex Vitamins (B COMPLEX-B12) TABS Take 1 tablet by mouth daily.        . Cholecalciferol (VITAMIN D3) 1000 UNITS CAPS Take 1 capsule by mouth daily.        . Fluticasone-Salmeterol (ADVAIR) 250-50 MCG/DOSE AEPB Inhale 1 puff into the lungs every 12 (twelve) hours.        Marland Kitchen ipratropium-albuterol (DUONEB) 0.5-2.5 (3) MG/3ML SOLN Take 3 mLs by nebulization 4 (four) times daily as needed.       Marland Kitchen levothyroxine (SYNTHROID, LEVOTHROID) 50 MCG tablet Take 50 mcg by mouth daily.      . metoprolol succinate (TOPROL-XL) 25 MG 24 hr tablet Take 25 mg by mouth every evening.       Marland Kitchen  montelukast (SINGULAIR) 10 MG tablet Take 10 mg by mouth at bedtime.        . sodium phosphates (OSMOPREP) 1.102-0.398 G TABS Take 1 tablet by mouth once.  32 tablet  0  . tiotropium (SPIRIVA) 18 MCG inhalation capsule Place 18 mcg into inhaler and inhale daily.        . peg 3350 powder (MOVIPREP) 100 G SOLR Take 1 kit (100 g total) by mouth once.  1 kit  0    No results found for this or any previous visit (from the past 48 hour(s)). No results found.  ROS  Blood pressure 106/66, pulse 78, temperature 98 F (36.7 C), temperature source Oral, resp. rate 20, height 5\' 3"  (1.6 m), weight 106 lb (48.081 kg), SpO2 100.00%. Physical Exam  Constitutional: She appears well-developed.       Thin female  HENT:  Mouth/Throat: Oropharynx is clear and moist.  Eyes: Conjunctivae are normal. No scleral icterus.  Neck: No thyromegaly present.  Cardiovascular: Normal rate, regular rhythm and  normal heart sounds.   No murmur heard. Respiratory: Effort normal.  GI: Soft. She exhibits no distension and no mass. There is no tenderness.  Musculoskeletal: She exhibits no edema.  Lymphadenopathy:    She has no cervical adenopathy.  Neurological: She is alert.  Skin: Skin is warm and dry.     Assessment/Plan Chronic diarrhea. History of rectal bleeding. Family history of colon carcinoma. Colonoscopy  Lunden Stieber U 10/24/2011, 2:14 PM

## 2011-10-24 NOTE — Op Note (Signed)
COLONOSCOPY PROCEDURE REPORT  PATIENT:  Lauren Mclaughlin  MR#:  [id number] Birthdate:  1950/05/13, 61 y.o., female Endoscopist:  Dr. Malissa Hippo, MD Referred By:  Dr. Ignatius Specking, MD Procedure Date: 10/24/2011  Procedure:   Colonoscopy  Indications:  Patient is 61 year old Caucasian female with chronic diarrhea who also had a few episodes of bleeding 7 weeks ago. Family history is positive for colon carcinoma and father APR at age 40. Patient's last colonoscopy in 5 years was in September 2006. She had flexible sigmoidoscopy by me had [facility] in [place] in 2011 when she had ischemic colitis.  Informed Consent:  The procedure and risks were reviewed with the patient and informed consent was obtained.  Medications:  Demerol 50 mg IV Versed 15 mg IV  Description of procedure:  After a digital rectal exam was performed, that colonoscope was advanced from the anus through the rectum and colon to the area of the cecum, ileocecal valve and appendiceal orifice. The cecum was deeply intubated. These structures were well-seen and photographed for the record. From the level of the cecum and ileocecal valve, the scope was slowly and cautiously withdrawn. The mucosal surfaces were carefully surveyed utilizing scope tip to flexion to facilitate fold flattening as needed. The scope was pulled down into the rectum where a thorough exam including retroflexion was performed. Terminal ileum was also examined.  Findings:   Prep excellent. Normal terminal ileum. Small polyp ablated via cold biopsy from proximal sigmoid colon. 2 small polyps ablated via cold biopsy from rectum and submitted together. Him biopsy taken from normal appearing mucosa of sigmoid colon. Small hemorrhoids below the dentate line.  Therapeutic/Diagnostic Maneuvers Performed:  See above  Complications:  None  Cecal Withdrawal Time:  14  minutes  Impression:  Normal terminal ileum. No endoscopic evidence of colitis. Sigmoid biopsy  taken to rule out collagenous or microscopic colitis. Small polyp ablated via cold biopsy from sigmoid colon. Small polyps ablated via cold biopsy from  rectum and submitted together. Small external hemorrhoids.  Recommendations:  Standard instructions given. Imodium OTC 2 mg by mouth every morning. I will contact patient with results of biopsy and further recommendations.  Katalyna Socarras U  10/24/2011 3:05 PM  CC: Dr. Ignatius Specking., MD & Dr. Bonnetta Barry ref. provider found

## 2011-10-31 ENCOUNTER — Encounter (HOSPITAL_COMMUNITY): Payer: Self-pay | Admitting: Internal Medicine

## 2011-11-01 ENCOUNTER — Encounter (INDEPENDENT_AMBULATORY_CARE_PROVIDER_SITE_OTHER): Payer: Self-pay | Admitting: *Deleted

## 2012-02-19 ENCOUNTER — Ambulatory Visit (INDEPENDENT_AMBULATORY_CARE_PROVIDER_SITE_OTHER): Admitting: Internal Medicine

## 2012-02-19 ENCOUNTER — Encounter (INDEPENDENT_AMBULATORY_CARE_PROVIDER_SITE_OTHER): Payer: Self-pay | Admitting: Internal Medicine

## 2012-02-19 VITALS — BP 106/70 | HR 72 | Temp 98.0°F | Resp 16 | Ht 63.0 in | Wt 109.0 lb

## 2012-02-19 DIAGNOSIS — R197 Diarrhea, unspecified: Secondary | ICD-10-CM

## 2012-02-19 MED ORDER — ALIGN PO CAPS: 1.0000 | ORAL_CAPSULE | Freq: Every day | ORAL | Status: DC

## 2012-02-19 MED ORDER — PSYLLIUM 0.52 G PO CAPS: 1.0000 g | ORAL_CAPSULE | Freq: Two times a day (BID) | ORAL | Status: DC

## 2012-02-19 NOTE — Patient Instructions (Signed)
Align 1 capsule by mouth daily. Metamucil 2 tablets by mouth twice daily. Please keep her stool diary for 4 weeks.

## 2012-02-19 NOTE — Progress Notes (Signed)
Presenting complaint;  Followup for diarrhea.  Subjective:  Patient is 61 year old Caucasian female who is here for reevaluation of chronic diarrhea. Diarrhea started in March 2011 following an episode of ischemic colitis for which she was treated at [facility]. She had colonoscopy in July 2013 revealing no evidence of endoscopic colitis. She had 2 small polyps which are hyperplastic. Random biopsy from sigmoid colon was negative for microscopic or collagenous colitis. She states she has done better in the last 2 weeks and she is having 3 bowel movements per day. She rarely has nocturnal diarrhea. She denies abdominal pain melena or rectal bleeding. She states she got constipated she weeks ago. Most of her bowel movements occur within a few minutes of meals. She has had 2 accidents since her colonoscopy over 3 months ago. She's been reluctant to take anti-spasmodics. She has gained 4 pounds since her last visit. She states her peak weight was 133 pounds over 4 years ago and one year she weighed 128 pounds. Family history is negative for celiac disease.  Current Medications: Current Outpatient Prescriptions  Medication Sig Dispense Refill  . ALPRAZolam (XANAX) 0.5 MG tablet Take 0.5 mg by mouth 3 (three) times daily as needed. For anxiety      . B Complex Vitamins (B COMPLEX-B12) TABS Take 1 tablet by mouth daily.        . Cholecalciferol (VITAMIN D3) 1000 UNITS CAPS Take 1 capsule by mouth daily.        . Fluticasone-Salmeterol (ADVAIR) 250-50 MCG/DOSE AEPB Inhale 1 puff into the lungs every 12 (twelve) hours.        Marland Kitchen ipratropium-albuterol (DUONEB) 0.5-2.5 (3) MG/3ML SOLN Take 3 mLs by nebulization 4 (four) times daily as needed.       Marland Kitchen levothyroxine (SYNTHROID, LEVOTHROID) 50 MCG tablet Take 50 mcg by mouth daily.      . metoprolol succinate (TOPROL-XL) 25 MG 24 hr tablet Take 25 mg by mouth every evening.       . montelukast (SINGULAIR) 10 MG tablet Take 10 mg by mouth at  bedtime.        Marland Kitchen tiotropium (SPIRIVA) 18 MCG inhalation capsule Place 18 mcg into inhaler and inhale daily.        Marland Kitchen DISCONTD: DULoxetine (CYMBALTA) 60 MG capsule Take 60 mg by mouth daily.        Marland Kitchen DISCONTD: omeprazole (PRILOSEC) 20 MG capsule Take 20 mg by mouth daily.           Objective: Blood pressure 106/70, pulse 72, temperature 98 F (36.7 C), temperature source Oral, resp. rate 16, height 5\' 3"  (1.6 m), weight 109 lb (49.442 kg). Pleasant thin Caucasian female who is in no acute distress. She is on O2 via nasal cannula. Conjunctiva is pink. Sclera is nonicteric Oropharyngeal mucosa is normal. No neck masses or thyromegaly noted.. Abdomen is flat. Bowel sounds are normal. On palpation abdomen is soft and nontender without organomegaly or masses. No LE edema or clubbing noted.    Assessment:  Chronic diarrhea felt to be secondary to irritable bowel syndrome; she has not responded well to therapy. It is interesting to note that she got constipated a few weeks ago. If she starts to lose weight again will consider evaluation for malabsorptive syndrome.   Plan:  Align 1 capsule by mouth daily. Metamucil 2 tablets by mouth twice daily. She'll keep stool diary for 4 weeks and mail it to our office

## 2012-02-24 DIAGNOSIS — R197 Diarrhea, unspecified: Secondary | ICD-10-CM | POA: Insufficient documentation

## 2012-08-19 ENCOUNTER — Encounter (INDEPENDENT_AMBULATORY_CARE_PROVIDER_SITE_OTHER): Payer: Self-pay | Admitting: Internal Medicine

## 2012-08-19 ENCOUNTER — Ambulatory Visit (INDEPENDENT_AMBULATORY_CARE_PROVIDER_SITE_OTHER): Admitting: Internal Medicine

## 2012-08-19 VITALS — BP 128/74 | HR 72 | Temp 97.6°F | Resp 16 | Ht 63.0 in | Wt 110.0 lb

## 2012-08-19 DIAGNOSIS — K589 Irritable bowel syndrome without diarrhea: Secondary | ICD-10-CM

## 2012-08-19 DIAGNOSIS — R197 Diarrhea, unspecified: Secondary | ICD-10-CM

## 2012-08-19 NOTE — Patient Instructions (Signed)
Call if diarrhea relapses 

## 2012-08-19 NOTE — Progress Notes (Signed)
Presenting complaint;  Followup for diarrhea/IBS.  Subjective:  Patient is 62 year old Caucasian female who has chronic diarrhea felt to be secondary to IBS. She had colonoscopy last year. She was last seen 6 months ago. She says she just could not tolerate Metamucil. She is trying her best to increase intake of fiber rich foods. She states she keeps getting better as far as diarrhea is concerned. In the last 2 months she only had 6 loose stools. She has not suffered any accidents or nocturnal diarrhea. She also denies abdominal pain rectal bleeding or melena. Her weight is only 1 pound higher since her last visit 6 months ago but she has gained 5 pounds in one year. She complains of intermittent/chronic pain over right lower rib cage. This pain seems to be worse with certain movements or bending.  Current Medications: Current Outpatient Prescriptions  Medication Sig Dispense Refill  . ALPRAZolam (XANAX) 0.5 MG tablet Take 0.5 mg by mouth 3 (three) times daily as needed. For anxiety      . B Complex Vitamins (B COMPLEX-B12) TABS Take 1 tablet by mouth daily.        . bifidobacterium infantis (ALIGN) capsule Take 1 capsule by mouth daily.  14 capsule  0  . Cholecalciferol (VITAMIN D3) 1000 UNITS CAPS Take 1 capsule by mouth daily.        . Fluticasone-Salmeterol (ADVAIR) 250-50 MCG/DOSE AEPB Inhale 1 puff into the lungs every 12 (twelve) hours.        Marland Kitchen ipratropium-albuterol (DUONEB) 0.5-2.5 (3) MG/3ML SOLN Take 3 mLs by nebulization 4 (four) times daily as needed.       Marland Kitchen levothyroxine (SYNTHROID, LEVOTHROID) 50 MCG tablet Take 50 mcg by mouth daily.      . metoprolol succinate (TOPROL-XL) 25 MG 24 hr tablet Take 25 mg by mouth every evening.       . montelukast (SINGULAIR) 10 MG tablet Take 10 mg by mouth at bedtime.        . psyllium (REGULOID) 0.52 G capsule Take 2 capsules (1.04 g total) by mouth 2 (two) times daily.      Marland Kitchen tiotropium (SPIRIVA) 18 MCG inhalation capsule Place 18 mcg into  inhaler and inhale daily.        . [DISCONTINUED] DULoxetine (CYMBALTA) 60 MG capsule Take 60 mg by mouth daily.        . [DISCONTINUED] omeprazole (PRILOSEC) 20 MG capsule Take 20 mg by mouth daily.         No current facility-administered medications for this visit.     Objective: Blood pressure 128/74, pulse 72, temperature 97.6 F (36.4 C), temperature source Oral, resp. rate 16, height 5\' 3"  (1.6 m), weight 110 lb (49.896 kg). Patient is alert and in no acute distress. She is on O2 via nasal cannula. Conjunctiva is pink. Sclera is nonicteric Oropharyngeal mucosa is normal. No neck masses or thyromegaly noted. Abdomen is flat. Bowel sounds are normal. Abdomen is soft and nontender. No organomegaly or masses noted. No LE edema or clubbing noted.    Assessment:  #1. IBS. She is doing well with therapy. #2. Right rib cage pain appears to be musculoskeletal.       Further evaluation per Dr.Vyas.    Plan: . Patient will return for office visit in one year unless symptoms regress. Prescription for align 1 capsule by mouth daily to be issued.

## 2012-08-20 ENCOUNTER — Telehealth (INDEPENDENT_AMBULATORY_CARE_PROVIDER_SITE_OTHER): Payer: Self-pay | Admitting: *Deleted

## 2012-08-20 MED ORDER — ALIGN PO CAPS: 1.0000 | ORAL_CAPSULE | Freq: Every day | ORAL | Status: DC

## 2012-08-20 NOTE — Telephone Encounter (Signed)
This is the telephone number for [employer] for this patient  [phone]

## 2013-05-06 ENCOUNTER — Encounter (INDEPENDENT_AMBULATORY_CARE_PROVIDER_SITE_OTHER): Payer: Self-pay | Admitting: *Deleted

## 2013-08-19 ENCOUNTER — Ambulatory Visit (INDEPENDENT_AMBULATORY_CARE_PROVIDER_SITE_OTHER): Admitting: Internal Medicine

## 2016-04-12 DIAGNOSIS — F419 Anxiety disorder, unspecified: Secondary | ICD-10-CM | POA: Diagnosis not present

## 2016-04-12 DIAGNOSIS — Z299 Encounter for prophylactic measures, unspecified: Secondary | ICD-10-CM | POA: Diagnosis not present

## 2016-04-12 DIAGNOSIS — J449 Chronic obstructive pulmonary disease, unspecified: Secondary | ICD-10-CM | POA: Diagnosis not present

## 2016-04-12 DIAGNOSIS — Z6826 Body mass index (BMI) 26.0-26.9, adult: Secondary | ICD-10-CM | POA: Diagnosis not present

## 2016-04-12 DIAGNOSIS — Z713 Dietary counseling and surveillance: Secondary | ICD-10-CM | POA: Diagnosis not present

## 2016-05-08 DIAGNOSIS — J449 Chronic obstructive pulmonary disease, unspecified: Secondary | ICD-10-CM | POA: Diagnosis not present

## 2016-05-08 DIAGNOSIS — F419 Anxiety disorder, unspecified: Secondary | ICD-10-CM | POA: Diagnosis not present

## 2016-05-08 DIAGNOSIS — Z87891 Personal history of nicotine dependence: Secondary | ICD-10-CM | POA: Diagnosis not present

## 2016-05-08 DIAGNOSIS — Z6825 Body mass index (BMI) 25.0-25.9, adult: Secondary | ICD-10-CM | POA: Diagnosis not present

## 2016-05-08 DIAGNOSIS — Z713 Dietary counseling and surveillance: Secondary | ICD-10-CM | POA: Diagnosis not present

## 2016-05-08 DIAGNOSIS — Z299 Encounter for prophylactic measures, unspecified: Secondary | ICD-10-CM | POA: Diagnosis not present

## 2016-07-12 DIAGNOSIS — F419 Anxiety disorder, unspecified: Secondary | ICD-10-CM | POA: Diagnosis not present

## 2016-07-12 DIAGNOSIS — J449 Chronic obstructive pulmonary disease, unspecified: Secondary | ICD-10-CM | POA: Diagnosis not present

## 2016-09-05 DIAGNOSIS — Z6825 Body mass index (BMI) 25.0-25.9, adult: Secondary | ICD-10-CM | POA: Diagnosis not present

## 2016-09-05 DIAGNOSIS — E2839 Other primary ovarian failure: Secondary | ICD-10-CM | POA: Diagnosis not present

## 2016-09-05 DIAGNOSIS — J449 Chronic obstructive pulmonary disease, unspecified: Secondary | ICD-10-CM | POA: Diagnosis not present

## 2016-09-05 DIAGNOSIS — R251 Tremor, unspecified: Secondary | ICD-10-CM | POA: Diagnosis not present

## 2016-09-05 DIAGNOSIS — Z1211 Encounter for screening for malignant neoplasm of colon: Secondary | ICD-10-CM | POA: Diagnosis not present

## 2016-09-05 DIAGNOSIS — Z7189 Other specified counseling: Secondary | ICD-10-CM | POA: Diagnosis not present

## 2016-09-05 DIAGNOSIS — Z0000 Encounter for general adult medical examination without abnormal findings: Secondary | ICD-10-CM | POA: Diagnosis not present

## 2016-09-05 DIAGNOSIS — R5383 Other fatigue: Secondary | ICD-10-CM | POA: Diagnosis not present

## 2016-09-05 DIAGNOSIS — Z299 Encounter for prophylactic measures, unspecified: Secondary | ICD-10-CM | POA: Diagnosis not present

## 2016-09-05 DIAGNOSIS — Z79899 Other long term (current) drug therapy: Secondary | ICD-10-CM | POA: Diagnosis not present

## 2016-09-05 DIAGNOSIS — Z1389 Encounter for screening for other disorder: Secondary | ICD-10-CM | POA: Diagnosis not present

## 2016-09-05 DIAGNOSIS — E039 Hypothyroidism, unspecified: Secondary | ICD-10-CM | POA: Diagnosis not present

## 2017-01-08 DIAGNOSIS — J449 Chronic obstructive pulmonary disease, unspecified: Secondary | ICD-10-CM | POA: Diagnosis not present

## 2017-01-08 DIAGNOSIS — Z299 Encounter for prophylactic measures, unspecified: Secondary | ICD-10-CM | POA: Diagnosis not present

## 2017-01-08 DIAGNOSIS — F419 Anxiety disorder, unspecified: Secondary | ICD-10-CM | POA: Diagnosis not present

## 2017-01-08 DIAGNOSIS — R000 Tachycardia, unspecified: Secondary | ICD-10-CM | POA: Diagnosis not present

## 2017-01-08 DIAGNOSIS — E039 Hypothyroidism, unspecified: Secondary | ICD-10-CM | POA: Diagnosis not present

## 2017-01-08 DIAGNOSIS — Z6827 Body mass index (BMI) 27.0-27.9, adult: Secondary | ICD-10-CM | POA: Diagnosis not present

## 2017-04-24 DIAGNOSIS — F419 Anxiety disorder, unspecified: Secondary | ICD-10-CM | POA: Diagnosis not present

## 2017-04-24 DIAGNOSIS — Z6826 Body mass index (BMI) 26.0-26.9, adult: Secondary | ICD-10-CM | POA: Diagnosis not present

## 2017-04-24 DIAGNOSIS — J449 Chronic obstructive pulmonary disease, unspecified: Secondary | ICD-10-CM | POA: Diagnosis not present

## 2017-04-24 DIAGNOSIS — Z299 Encounter for prophylactic measures, unspecified: Secondary | ICD-10-CM | POA: Diagnosis not present

## 2017-04-24 DIAGNOSIS — E039 Hypothyroidism, unspecified: Secondary | ICD-10-CM | POA: Diagnosis not present

## 2017-09-10 DIAGNOSIS — Z0000 Encounter for general adult medical examination without abnormal findings: Secondary | ICD-10-CM | POA: Diagnosis not present

## 2017-09-10 DIAGNOSIS — R5383 Other fatigue: Secondary | ICD-10-CM | POA: Diagnosis not present

## 2017-09-10 DIAGNOSIS — J449 Chronic obstructive pulmonary disease, unspecified: Secondary | ICD-10-CM | POA: Diagnosis not present

## 2017-09-10 DIAGNOSIS — E7800 Pure hypercholesterolemia, unspecified: Secondary | ICD-10-CM | POA: Diagnosis not present

## 2017-09-10 DIAGNOSIS — Z1339 Encounter for screening examination for other mental health and behavioral disorders: Secondary | ICD-10-CM | POA: Diagnosis not present

## 2017-09-10 DIAGNOSIS — Z299 Encounter for prophylactic measures, unspecified: Secondary | ICD-10-CM | POA: Diagnosis not present

## 2017-09-10 DIAGNOSIS — Z1331 Encounter for screening for depression: Secondary | ICD-10-CM | POA: Diagnosis not present

## 2017-09-10 DIAGNOSIS — E039 Hypothyroidism, unspecified: Secondary | ICD-10-CM | POA: Diagnosis not present

## 2017-09-10 DIAGNOSIS — Z6827 Body mass index (BMI) 27.0-27.9, adult: Secondary | ICD-10-CM | POA: Diagnosis not present

## 2017-09-10 DIAGNOSIS — Z7189 Other specified counseling: Secondary | ICD-10-CM | POA: Diagnosis not present

## 2017-09-10 DIAGNOSIS — Z1211 Encounter for screening for malignant neoplasm of colon: Secondary | ICD-10-CM | POA: Diagnosis not present

## 2017-09-10 DIAGNOSIS — Z79899 Other long term (current) drug therapy: Secondary | ICD-10-CM | POA: Diagnosis not present

## 2017-09-12 DIAGNOSIS — Z1231 Encounter for screening mammogram for malignant neoplasm of breast: Secondary | ICD-10-CM | POA: Diagnosis not present

## 2018-01-15 DIAGNOSIS — E039 Hypothyroidism, unspecified: Secondary | ICD-10-CM | POA: Diagnosis not present

## 2018-01-15 DIAGNOSIS — F419 Anxiety disorder, unspecified: Secondary | ICD-10-CM | POA: Diagnosis not present

## 2018-01-15 DIAGNOSIS — J449 Chronic obstructive pulmonary disease, unspecified: Secondary | ICD-10-CM | POA: Diagnosis not present

## 2018-01-15 DIAGNOSIS — Z299 Encounter for prophylactic measures, unspecified: Secondary | ICD-10-CM | POA: Diagnosis not present

## 2018-01-15 DIAGNOSIS — Z23 Encounter for immunization: Secondary | ICD-10-CM | POA: Diagnosis not present

## 2018-01-15 DIAGNOSIS — Z789 Other specified health status: Secondary | ICD-10-CM | POA: Diagnosis not present

## 2018-01-15 DIAGNOSIS — Z6826 Body mass index (BMI) 26.0-26.9, adult: Secondary | ICD-10-CM | POA: Diagnosis not present

## 2018-02-26 DIAGNOSIS — E2839 Other primary ovarian failure: Secondary | ICD-10-CM | POA: Diagnosis not present

## 2018-04-11 DIAGNOSIS — Z6826 Body mass index (BMI) 26.0-26.9, adult: Secondary | ICD-10-CM | POA: Diagnosis not present

## 2018-04-11 DIAGNOSIS — R252 Cramp and spasm: Secondary | ICD-10-CM | POA: Diagnosis not present

## 2018-04-11 DIAGNOSIS — M7702 Medial epicondylitis, left elbow: Secondary | ICD-10-CM | POA: Diagnosis not present

## 2018-04-11 DIAGNOSIS — Z299 Encounter for prophylactic measures, unspecified: Secondary | ICD-10-CM | POA: Diagnosis not present

## 2018-04-11 DIAGNOSIS — J449 Chronic obstructive pulmonary disease, unspecified: Secondary | ICD-10-CM | POA: Diagnosis not present

## 2018-04-11 DIAGNOSIS — E039 Hypothyroidism, unspecified: Secondary | ICD-10-CM | POA: Diagnosis not present

## 2018-07-24 DIAGNOSIS — Z299 Encounter for prophylactic measures, unspecified: Secondary | ICD-10-CM | POA: Diagnosis not present

## 2018-07-24 DIAGNOSIS — Z713 Dietary counseling and surveillance: Secondary | ICD-10-CM | POA: Diagnosis not present

## 2018-07-24 DIAGNOSIS — E039 Hypothyroidism, unspecified: Secondary | ICD-10-CM | POA: Diagnosis not present

## 2018-07-24 DIAGNOSIS — F419 Anxiety disorder, unspecified: Secondary | ICD-10-CM | POA: Diagnosis not present

## 2018-07-24 DIAGNOSIS — J449 Chronic obstructive pulmonary disease, unspecified: Secondary | ICD-10-CM | POA: Diagnosis not present

## 2018-08-27 DIAGNOSIS — K047 Periapical abscess without sinus: Secondary | ICD-10-CM | POA: Diagnosis not present

## 2018-08-27 DIAGNOSIS — R350 Frequency of micturition: Secondary | ICD-10-CM | POA: Diagnosis not present

## 2018-08-27 DIAGNOSIS — L040 Acute lymphadenitis of face, head and neck: Secondary | ICD-10-CM | POA: Diagnosis not present

## 2018-08-27 DIAGNOSIS — K0889 Other specified disorders of teeth and supporting structures: Secondary | ICD-10-CM | POA: Diagnosis not present

## 2018-08-27 DIAGNOSIS — E039 Hypothyroidism, unspecified: Secondary | ICD-10-CM | POA: Diagnosis not present

## 2018-08-27 DIAGNOSIS — R509 Fever, unspecified: Secondary | ICD-10-CM | POA: Diagnosis not present

## 2018-08-27 DIAGNOSIS — R0602 Shortness of breath: Secondary | ICD-10-CM | POA: Diagnosis not present

## 2018-08-27 DIAGNOSIS — R55 Syncope and collapse: Secondary | ICD-10-CM | POA: Diagnosis not present

## 2018-08-27 DIAGNOSIS — M47812 Spondylosis without myelopathy or radiculopathy, cervical region: Secondary | ICD-10-CM | POA: Diagnosis not present

## 2018-08-27 DIAGNOSIS — Z299 Encounter for prophylactic measures, unspecified: Secondary | ICD-10-CM | POA: Diagnosis not present

## 2018-08-27 DIAGNOSIS — R590 Localized enlarged lymph nodes: Secondary | ICD-10-CM | POA: Diagnosis not present

## 2018-08-27 DIAGNOSIS — J449 Chronic obstructive pulmonary disease, unspecified: Secondary | ICD-10-CM | POA: Diagnosis not present

## 2018-08-27 DIAGNOSIS — J9611 Chronic respiratory failure with hypoxia: Secondary | ICD-10-CM | POA: Diagnosis not present

## 2018-08-27 DIAGNOSIS — J441 Chronic obstructive pulmonary disease with (acute) exacerbation: Secondary | ICD-10-CM | POA: Diagnosis not present

## 2018-08-29 DIAGNOSIS — R55 Syncope and collapse: Secondary | ICD-10-CM | POA: Diagnosis not present

## 2018-08-29 DIAGNOSIS — J9611 Chronic respiratory failure with hypoxia: Secondary | ICD-10-CM | POA: Diagnosis not present

## 2018-08-29 DIAGNOSIS — I700 Atherosclerosis of aorta: Secondary | ICD-10-CM | POA: Diagnosis present

## 2018-08-29 DIAGNOSIS — M47812 Spondylosis without myelopathy or radiculopathy, cervical region: Secondary | ICD-10-CM | POA: Diagnosis not present

## 2018-08-29 DIAGNOSIS — R531 Weakness: Secondary | ICD-10-CM | POA: Diagnosis present

## 2018-08-29 DIAGNOSIS — E039 Hypothyroidism, unspecified: Secondary | ICD-10-CM | POA: Diagnosis present

## 2018-08-29 DIAGNOSIS — J441 Chronic obstructive pulmonary disease with (acute) exacerbation: Secondary | ICD-10-CM | POA: Diagnosis not present

## 2018-08-29 DIAGNOSIS — K029 Dental caries, unspecified: Secondary | ICD-10-CM | POA: Diagnosis present

## 2018-08-29 DIAGNOSIS — K0889 Other specified disorders of teeth and supporting structures: Secondary | ICD-10-CM | POA: Diagnosis present

## 2018-08-29 DIAGNOSIS — Z9981 Dependence on supplemental oxygen: Secondary | ICD-10-CM | POA: Diagnosis not present

## 2018-08-29 DIAGNOSIS — L040 Acute lymphadenitis of face, head and neck: Secondary | ICD-10-CM | POA: Diagnosis present

## 2018-08-29 DIAGNOSIS — K047 Periapical abscess without sinus: Secondary | ICD-10-CM | POA: Diagnosis not present

## 2018-08-29 DIAGNOSIS — Z880 Allergy status to penicillin: Secondary | ICD-10-CM | POA: Diagnosis not present

## 2018-08-29 DIAGNOSIS — Z79899 Other long term (current) drug therapy: Secondary | ICD-10-CM | POA: Diagnosis not present

## 2018-08-29 DIAGNOSIS — R590 Localized enlarged lymph nodes: Secondary | ICD-10-CM | POA: Diagnosis not present

## 2018-08-29 DIAGNOSIS — R509 Fever, unspecified: Secondary | ICD-10-CM | POA: Diagnosis not present

## 2018-08-29 DIAGNOSIS — Z20828 Contact with and (suspected) exposure to other viral communicable diseases: Secondary | ICD-10-CM | POA: Diagnosis present

## 2018-08-29 DIAGNOSIS — J449 Chronic obstructive pulmonary disease, unspecified: Secondary | ICD-10-CM | POA: Diagnosis not present

## 2018-08-29 DIAGNOSIS — R0602 Shortness of breath: Secondary | ICD-10-CM | POA: Diagnosis present

## 2018-08-29 DIAGNOSIS — Z7951 Long term (current) use of inhaled steroids: Secondary | ICD-10-CM | POA: Diagnosis not present

## 2018-08-29 DIAGNOSIS — Z882 Allergy status to sulfonamides status: Secondary | ICD-10-CM | POA: Diagnosis not present

## 2018-09-04 DIAGNOSIS — J9611 Chronic respiratory failure with hypoxia: Secondary | ICD-10-CM | POA: Diagnosis not present

## 2018-09-04 DIAGNOSIS — R32 Unspecified urinary incontinence: Secondary | ICD-10-CM | POA: Diagnosis not present

## 2018-09-04 DIAGNOSIS — M47812 Spondylosis without myelopathy or radiculopathy, cervical region: Secondary | ICD-10-CM | POA: Diagnosis not present

## 2018-09-04 DIAGNOSIS — K047 Periapical abscess without sinus: Secondary | ICD-10-CM | POA: Diagnosis not present

## 2018-09-04 DIAGNOSIS — Z792 Long term (current) use of antibiotics: Secondary | ICD-10-CM | POA: Diagnosis not present

## 2018-09-04 DIAGNOSIS — Z09 Encounter for follow-up examination after completed treatment for conditions other than malignant neoplasm: Secondary | ICD-10-CM | POA: Diagnosis not present

## 2018-09-04 DIAGNOSIS — Z6826 Body mass index (BMI) 26.0-26.9, adult: Secondary | ICD-10-CM | POA: Diagnosis not present

## 2018-09-04 DIAGNOSIS — Z9981 Dependence on supplemental oxygen: Secondary | ICD-10-CM | POA: Diagnosis not present

## 2018-09-04 DIAGNOSIS — F329 Major depressive disorder, single episode, unspecified: Secondary | ICD-10-CM | POA: Diagnosis not present

## 2018-09-04 DIAGNOSIS — F419 Anxiety disorder, unspecified: Secondary | ICD-10-CM | POA: Diagnosis not present

## 2018-09-04 DIAGNOSIS — J449 Chronic obstructive pulmonary disease, unspecified: Secondary | ICD-10-CM | POA: Diagnosis not present

## 2018-09-04 DIAGNOSIS — R531 Weakness: Secondary | ICD-10-CM | POA: Diagnosis not present

## 2018-09-04 DIAGNOSIS — I7090 Unspecified atherosclerosis: Secondary | ICD-10-CM | POA: Diagnosis not present

## 2018-09-04 DIAGNOSIS — Z9181 History of falling: Secondary | ICD-10-CM | POA: Diagnosis not present

## 2018-09-04 DIAGNOSIS — J441 Chronic obstructive pulmonary disease with (acute) exacerbation: Secondary | ICD-10-CM | POA: Diagnosis not present

## 2018-09-04 DIAGNOSIS — L040 Acute lymphadenitis of face, head and neck: Secondary | ICD-10-CM | POA: Diagnosis not present

## 2018-09-04 DIAGNOSIS — Z299 Encounter for prophylactic measures, unspecified: Secondary | ICD-10-CM | POA: Diagnosis not present

## 2018-09-08 DIAGNOSIS — J441 Chronic obstructive pulmonary disease with (acute) exacerbation: Secondary | ICD-10-CM | POA: Diagnosis not present

## 2018-09-08 DIAGNOSIS — L040 Acute lymphadenitis of face, head and neck: Secondary | ICD-10-CM | POA: Diagnosis not present

## 2018-09-08 DIAGNOSIS — M47812 Spondylosis without myelopathy or radiculopathy, cervical region: Secondary | ICD-10-CM | POA: Diagnosis not present

## 2018-09-08 DIAGNOSIS — F419 Anxiety disorder, unspecified: Secondary | ICD-10-CM | POA: Diagnosis not present

## 2018-09-08 DIAGNOSIS — R531 Weakness: Secondary | ICD-10-CM | POA: Diagnosis not present

## 2018-09-08 DIAGNOSIS — K047 Periapical abscess without sinus: Secondary | ICD-10-CM | POA: Diagnosis not present

## 2018-09-11 DIAGNOSIS — J441 Chronic obstructive pulmonary disease with (acute) exacerbation: Secondary | ICD-10-CM | POA: Diagnosis not present

## 2018-09-11 DIAGNOSIS — M47812 Spondylosis without myelopathy or radiculopathy, cervical region: Secondary | ICD-10-CM | POA: Diagnosis not present

## 2018-09-11 DIAGNOSIS — L040 Acute lymphadenitis of face, head and neck: Secondary | ICD-10-CM | POA: Diagnosis not present

## 2018-09-11 DIAGNOSIS — K047 Periapical abscess without sinus: Secondary | ICD-10-CM | POA: Diagnosis not present

## 2018-09-11 DIAGNOSIS — F419 Anxiety disorder, unspecified: Secondary | ICD-10-CM | POA: Diagnosis not present

## 2018-09-11 DIAGNOSIS — R531 Weakness: Secondary | ICD-10-CM | POA: Diagnosis not present

## 2018-09-16 DIAGNOSIS — K047 Periapical abscess without sinus: Secondary | ICD-10-CM | POA: Diagnosis not present

## 2018-09-16 DIAGNOSIS — M47812 Spondylosis without myelopathy or radiculopathy, cervical region: Secondary | ICD-10-CM | POA: Diagnosis not present

## 2018-09-16 DIAGNOSIS — F419 Anxiety disorder, unspecified: Secondary | ICD-10-CM | POA: Diagnosis not present

## 2018-09-16 DIAGNOSIS — L040 Acute lymphadenitis of face, head and neck: Secondary | ICD-10-CM | POA: Diagnosis not present

## 2018-09-16 DIAGNOSIS — R531 Weakness: Secondary | ICD-10-CM | POA: Diagnosis not present

## 2018-09-16 DIAGNOSIS — J441 Chronic obstructive pulmonary disease with (acute) exacerbation: Secondary | ICD-10-CM | POA: Diagnosis not present

## 2018-09-18 DIAGNOSIS — R531 Weakness: Secondary | ICD-10-CM | POA: Diagnosis not present

## 2018-09-18 DIAGNOSIS — L040 Acute lymphadenitis of face, head and neck: Secondary | ICD-10-CM | POA: Diagnosis not present

## 2018-09-18 DIAGNOSIS — F419 Anxiety disorder, unspecified: Secondary | ICD-10-CM | POA: Diagnosis not present

## 2018-09-18 DIAGNOSIS — M47812 Spondylosis without myelopathy or radiculopathy, cervical region: Secondary | ICD-10-CM | POA: Diagnosis not present

## 2018-09-18 DIAGNOSIS — J441 Chronic obstructive pulmonary disease with (acute) exacerbation: Secondary | ICD-10-CM | POA: Diagnosis not present

## 2018-09-18 DIAGNOSIS — K047 Periapical abscess without sinus: Secondary | ICD-10-CM | POA: Diagnosis not present

## 2018-09-23 DIAGNOSIS — L040 Acute lymphadenitis of face, head and neck: Secondary | ICD-10-CM | POA: Diagnosis not present

## 2018-09-23 DIAGNOSIS — M47812 Spondylosis without myelopathy or radiculopathy, cervical region: Secondary | ICD-10-CM | POA: Diagnosis not present

## 2018-09-23 DIAGNOSIS — K047 Periapical abscess without sinus: Secondary | ICD-10-CM | POA: Diagnosis not present

## 2018-09-23 DIAGNOSIS — R531 Weakness: Secondary | ICD-10-CM | POA: Diagnosis not present

## 2018-09-23 DIAGNOSIS — J441 Chronic obstructive pulmonary disease with (acute) exacerbation: Secondary | ICD-10-CM | POA: Diagnosis not present

## 2018-09-23 DIAGNOSIS — F419 Anxiety disorder, unspecified: Secondary | ICD-10-CM | POA: Diagnosis not present

## 2018-09-25 DIAGNOSIS — L040 Acute lymphadenitis of face, head and neck: Secondary | ICD-10-CM | POA: Diagnosis not present

## 2018-09-25 DIAGNOSIS — J441 Chronic obstructive pulmonary disease with (acute) exacerbation: Secondary | ICD-10-CM | POA: Diagnosis not present

## 2018-09-25 DIAGNOSIS — M47812 Spondylosis without myelopathy or radiculopathy, cervical region: Secondary | ICD-10-CM | POA: Diagnosis not present

## 2018-09-25 DIAGNOSIS — F419 Anxiety disorder, unspecified: Secondary | ICD-10-CM | POA: Diagnosis not present

## 2018-09-25 DIAGNOSIS — R531 Weakness: Secondary | ICD-10-CM | POA: Diagnosis not present

## 2018-09-25 DIAGNOSIS — K047 Periapical abscess without sinus: Secondary | ICD-10-CM | POA: Diagnosis not present

## 2018-09-29 DIAGNOSIS — M47812 Spondylosis without myelopathy or radiculopathy, cervical region: Secondary | ICD-10-CM | POA: Diagnosis not present

## 2018-09-29 DIAGNOSIS — R531 Weakness: Secondary | ICD-10-CM | POA: Diagnosis not present

## 2018-09-29 DIAGNOSIS — F419 Anxiety disorder, unspecified: Secondary | ICD-10-CM | POA: Diagnosis not present

## 2018-09-29 DIAGNOSIS — J441 Chronic obstructive pulmonary disease with (acute) exacerbation: Secondary | ICD-10-CM | POA: Diagnosis not present

## 2018-09-29 DIAGNOSIS — K047 Periapical abscess without sinus: Secondary | ICD-10-CM | POA: Diagnosis not present

## 2018-09-29 DIAGNOSIS — L040 Acute lymphadenitis of face, head and neck: Secondary | ICD-10-CM | POA: Diagnosis not present

## 2018-10-02 DIAGNOSIS — K047 Periapical abscess without sinus: Secondary | ICD-10-CM | POA: Diagnosis not present

## 2018-10-02 DIAGNOSIS — M47812 Spondylosis without myelopathy or radiculopathy, cervical region: Secondary | ICD-10-CM | POA: Diagnosis not present

## 2018-10-02 DIAGNOSIS — F419 Anxiety disorder, unspecified: Secondary | ICD-10-CM | POA: Diagnosis not present

## 2018-10-02 DIAGNOSIS — L040 Acute lymphadenitis of face, head and neck: Secondary | ICD-10-CM | POA: Diagnosis not present

## 2018-10-02 DIAGNOSIS — J441 Chronic obstructive pulmonary disease with (acute) exacerbation: Secondary | ICD-10-CM | POA: Diagnosis not present

## 2018-10-02 DIAGNOSIS — R531 Weakness: Secondary | ICD-10-CM | POA: Diagnosis not present

## 2018-10-04 DIAGNOSIS — Z9181 History of falling: Secondary | ICD-10-CM | POA: Diagnosis not present

## 2018-10-04 DIAGNOSIS — F329 Major depressive disorder, single episode, unspecified: Secondary | ICD-10-CM | POA: Diagnosis not present

## 2018-10-04 DIAGNOSIS — K047 Periapical abscess without sinus: Secondary | ICD-10-CM | POA: Diagnosis not present

## 2018-10-04 DIAGNOSIS — M47812 Spondylosis without myelopathy or radiculopathy, cervical region: Secondary | ICD-10-CM | POA: Diagnosis not present

## 2018-10-04 DIAGNOSIS — R531 Weakness: Secondary | ICD-10-CM | POA: Diagnosis not present

## 2018-10-04 DIAGNOSIS — L040 Acute lymphadenitis of face, head and neck: Secondary | ICD-10-CM | POA: Diagnosis not present

## 2018-10-04 DIAGNOSIS — Z9981 Dependence on supplemental oxygen: Secondary | ICD-10-CM | POA: Diagnosis not present

## 2018-10-04 DIAGNOSIS — J441 Chronic obstructive pulmonary disease with (acute) exacerbation: Secondary | ICD-10-CM | POA: Diagnosis not present

## 2018-10-04 DIAGNOSIS — Z792 Long term (current) use of antibiotics: Secondary | ICD-10-CM | POA: Diagnosis not present

## 2018-10-04 DIAGNOSIS — I7090 Unspecified atherosclerosis: Secondary | ICD-10-CM | POA: Diagnosis not present

## 2018-10-04 DIAGNOSIS — F419 Anxiety disorder, unspecified: Secondary | ICD-10-CM | POA: Diagnosis not present

## 2018-10-09 DIAGNOSIS — L040 Acute lymphadenitis of face, head and neck: Secondary | ICD-10-CM | POA: Diagnosis not present

## 2018-10-09 DIAGNOSIS — R531 Weakness: Secondary | ICD-10-CM | POA: Diagnosis not present

## 2018-10-09 DIAGNOSIS — K047 Periapical abscess without sinus: Secondary | ICD-10-CM | POA: Diagnosis not present

## 2018-10-09 DIAGNOSIS — M47812 Spondylosis without myelopathy or radiculopathy, cervical region: Secondary | ICD-10-CM | POA: Diagnosis not present

## 2018-10-09 DIAGNOSIS — F419 Anxiety disorder, unspecified: Secondary | ICD-10-CM | POA: Diagnosis not present

## 2018-10-09 DIAGNOSIS — J441 Chronic obstructive pulmonary disease with (acute) exacerbation: Secondary | ICD-10-CM | POA: Diagnosis not present

## 2018-10-14 DIAGNOSIS — K047 Periapical abscess without sinus: Secondary | ICD-10-CM | POA: Diagnosis not present

## 2018-10-14 DIAGNOSIS — R531 Weakness: Secondary | ICD-10-CM | POA: Diagnosis not present

## 2018-10-14 DIAGNOSIS — F419 Anxiety disorder, unspecified: Secondary | ICD-10-CM | POA: Diagnosis not present

## 2018-10-14 DIAGNOSIS — M47812 Spondylosis without myelopathy or radiculopathy, cervical region: Secondary | ICD-10-CM | POA: Diagnosis not present

## 2018-10-14 DIAGNOSIS — J441 Chronic obstructive pulmonary disease with (acute) exacerbation: Secondary | ICD-10-CM | POA: Diagnosis not present

## 2018-10-14 DIAGNOSIS — L040 Acute lymphadenitis of face, head and neck: Secondary | ICD-10-CM | POA: Diagnosis not present

## 2018-12-12 DIAGNOSIS — Z299 Encounter for prophylactic measures, unspecified: Secondary | ICD-10-CM | POA: Diagnosis not present

## 2018-12-12 DIAGNOSIS — Z6826 Body mass index (BMI) 26.0-26.9, adult: Secondary | ICD-10-CM | POA: Diagnosis not present

## 2018-12-12 DIAGNOSIS — Z713 Dietary counseling and surveillance: Secondary | ICD-10-CM | POA: Diagnosis not present

## 2018-12-12 DIAGNOSIS — J449 Chronic obstructive pulmonary disease, unspecified: Secondary | ICD-10-CM | POA: Diagnosis not present

## 2018-12-12 DIAGNOSIS — F419 Anxiety disorder, unspecified: Secondary | ICD-10-CM | POA: Diagnosis not present

## 2019-01-14 DIAGNOSIS — Z6826 Body mass index (BMI) 26.0-26.9, adult: Secondary | ICD-10-CM | POA: Diagnosis not present

## 2019-01-14 DIAGNOSIS — J449 Chronic obstructive pulmonary disease, unspecified: Secondary | ICD-10-CM | POA: Diagnosis not present

## 2019-01-14 DIAGNOSIS — F419 Anxiety disorder, unspecified: Secondary | ICD-10-CM | POA: Diagnosis not present

## 2019-01-14 DIAGNOSIS — J441 Chronic obstructive pulmonary disease with (acute) exacerbation: Secondary | ICD-10-CM | POA: Diagnosis not present

## 2019-01-14 DIAGNOSIS — Z882 Allergy status to sulfonamides status: Secondary | ICD-10-CM | POA: Diagnosis not present

## 2019-01-14 DIAGNOSIS — Z880 Allergy status to penicillin: Secondary | ICD-10-CM | POA: Diagnosis not present

## 2019-01-14 DIAGNOSIS — Z79899 Other long term (current) drug therapy: Secondary | ICD-10-CM | POA: Diagnosis not present

## 2019-01-14 DIAGNOSIS — Z299 Encounter for prophylactic measures, unspecified: Secondary | ICD-10-CM | POA: Diagnosis not present

## 2019-01-14 DIAGNOSIS — J9611 Chronic respiratory failure with hypoxia: Secondary | ICD-10-CM | POA: Diagnosis not present

## 2019-01-14 DIAGNOSIS — R0602 Shortness of breath: Secondary | ICD-10-CM | POA: Diagnosis not present

## 2019-01-14 DIAGNOSIS — Z9981 Dependence on supplemental oxygen: Secondary | ICD-10-CM | POA: Diagnosis not present

## 2019-01-14 DIAGNOSIS — Z885 Allergy status to narcotic agent status: Secondary | ICD-10-CM | POA: Diagnosis not present

## 2019-01-14 DIAGNOSIS — Z87891 Personal history of nicotine dependence: Secondary | ICD-10-CM | POA: Diagnosis not present

## 2019-03-04 DIAGNOSIS — F419 Anxiety disorder, unspecified: Secondary | ICD-10-CM | POA: Diagnosis not present

## 2019-03-04 DIAGNOSIS — Z299 Encounter for prophylactic measures, unspecified: Secondary | ICD-10-CM | POA: Diagnosis not present

## 2019-03-04 DIAGNOSIS — Z6826 Body mass index (BMI) 26.0-26.9, adult: Secondary | ICD-10-CM | POA: Diagnosis not present

## 2019-03-04 DIAGNOSIS — J449 Chronic obstructive pulmonary disease, unspecified: Secondary | ICD-10-CM | POA: Diagnosis not present

## 2019-03-04 DIAGNOSIS — J9611 Chronic respiratory failure with hypoxia: Secondary | ICD-10-CM | POA: Diagnosis not present

## 2019-03-04 DIAGNOSIS — J441 Chronic obstructive pulmonary disease with (acute) exacerbation: Secondary | ICD-10-CM | POA: Diagnosis not present

## 2019-05-27 DIAGNOSIS — R5383 Other fatigue: Secondary | ICD-10-CM | POA: Diagnosis not present

## 2019-05-27 DIAGNOSIS — E039 Hypothyroidism, unspecified: Secondary | ICD-10-CM | POA: Diagnosis not present

## 2019-05-27 DIAGNOSIS — E7800 Pure hypercholesterolemia, unspecified: Secondary | ICD-10-CM | POA: Diagnosis not present

## 2019-05-27 DIAGNOSIS — Z79899 Other long term (current) drug therapy: Secondary | ICD-10-CM | POA: Diagnosis not present

## 2019-06-18 DIAGNOSIS — Z23 Encounter for immunization: Secondary | ICD-10-CM | POA: Diagnosis not present

## 2019-07-06 ENCOUNTER — Ambulatory Visit (INDEPENDENT_AMBULATORY_CARE_PROVIDER_SITE_OTHER): Payer: Medicare Other

## 2019-07-06 ENCOUNTER — Ambulatory Visit (INDEPENDENT_AMBULATORY_CARE_PROVIDER_SITE_OTHER): Payer: Medicare Other | Admitting: Pulmonary Disease

## 2019-07-06 ENCOUNTER — Encounter: Payer: Self-pay | Admitting: Pulmonary Disease

## 2019-07-06 ENCOUNTER — Other Ambulatory Visit: Payer: Self-pay

## 2019-07-06 VITALS — BP 130/80 | HR 83 | Temp 97.3°F | Ht 63.0 in | Wt 143.8 lb

## 2019-07-06 DIAGNOSIS — R0602 Shortness of breath: Secondary | ICD-10-CM | POA: Diagnosis not present

## 2019-07-06 MED ORDER — IPRATROPIUM-ALBUTEROL 0.5-2.5 (3) MG/3ML IN SOLN: 3.0000 mL | Freq: Four times a day (QID) | RESPIRATORY_TRACT | 3 refills | Status: DC | PRN

## 2019-07-06 MED ORDER — BUDESONIDE 0.5 MG/2ML IN SUSP: 0.5000 mg | Freq: Two times a day (BID) | RESPIRATORY_TRACT | 3 refills | Status: DC

## 2019-07-06 NOTE — Patient Instructions (Addendum)
Severe COPD  Continue DuoNeb up to 4 times a day Add Pulmicort twice a day  Graded exercises as tolerated  We will get an echocardiogram to assess cardiac function Obtain chest x-ray  Breathing study may be ordered if you feel you might be able to get the study done-this is the only way to assess the severity of your lung disease  We will see you in about 2 months

## 2019-07-06 NOTE — Progress Notes (Signed)
Lauren Mclaughlin    [id number]    1950/09/19  Primary Care Physician:Vyas, Costella Hatcher, MD  Referring Physician: Glenda Chroman, MD [address] [place],  [place] [address]  Chief complaint:   Patient with pulmonary fibrosis in for shortness of breath  HPI: Diagnosed with obstructive lung disease about 2010 She is oxygen dependent  The third of a pack a day smoker, quit about 10 years ago She is short of breath with mild to moderate exertion  Taking 50 steps to be challenging Going up steps is challenging  Unable to do inhalers secondary to not being able to generate enough inhalation pressure  Symptoms are progressive  No PFT on record, attempted PFT in the past and could not do it  Denies family history of obstructive lung disease Denies any other significant health problems She does have anxiety Hypothyroidism  No pets No pertinent occupational history   Outpatient Encounter Medications as of 07/06/2019  Medication Sig  . ALPRAZolam (XANAX) 0.5 MG tablet Take 0.5 mg by mouth 3 (three) times daily as needed. For anxiety  . B Complex Vitamins (B COMPLEX-B12) TABS Take 1 tablet by mouth daily.    . Cholecalciferol (VITAMIN D3) 1000 UNITS CAPS Take 1 capsule by mouth daily.    . Fluticasone-Salmeterol (ADVAIR) 250-50 MCG/DOSE AEPB Inhale 1 puff into the lungs every 12 (twelve) hours.    Marland Kitchen ipratropium-albuterol (DUONEB) 0.5-2.5 (3) MG/3ML SOLN Take 3 mLs by nebulization 4 (four) times daily as needed.  Marland Kitchen levothyroxine (SYNTHROID, LEVOTHROID) 50 MCG tablet Take 50 mcg by mouth daily.  . montelukast (SINGULAIR) 10 MG tablet Take 10 mg by mouth at bedtime.    . Multiple Vitamin (MULTI-VITAMIN PO) Take by mouth daily.  . [DISCONTINUED] ipratropium-albuterol (DUONEB) 0.5-2.5 (3) MG/3ML SOLN Take 3 mLs by nebulization 4 (four) times daily as needed.   . budesonide (PULMICORT) 0.5 MG/2ML nebulizer solution Take 2 mLs (0.5 mg total) by nebulization 2 (two) times daily.  .  [DISCONTINUED] bifidobacterium infantis (ALIGN) capsule Take 1 capsule by mouth daily.  . [DISCONTINUED] DULoxetine (CYMBALTA) 60 MG capsule Take 60 mg by mouth daily.    . [DISCONTINUED] metoprolol succinate (TOPROL-XL) 25 MG 24 hr tablet Take 25 mg by mouth every evening.   . [DISCONTINUED] omeprazole (PRILOSEC) 20 MG capsule Take 20 mg by mouth daily.    . [DISCONTINUED] tiotropium (SPIRIVA) 18 MCG inhalation capsule Place 18 mcg into inhaler and inhale daily.     No facility-administered encounter medications on file as of 07/06/2019.    Allergies as of 07/06/2019 - Review Complete 07/06/2019  Allergen Reaction Noted  . Amoxicillin  12/23/2007  . Cetirizine hcl  12/23/2007  . Ciprofloxacin  12/23/2007  . Fexofenadine  12/23/2007  . Loratadine  12/23/2007  . Other  10/12/2011  . Oxycodone-acetaminophen    . Povidone-iodine  12/23/2007  . Propoxyphene n-acetaminophen  12/23/2007  . Sulfamethoxazole  12/23/2007    Past Medical History:  Diagnosis Date  . Atypical chest pain    low risk pharmacologic imaging study, 7/09  . Cellulitis of buttock, right   . COPD (chronic obstructive pulmonary disease) ([place])   . 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 history of tobacco  . Dysrhythmia   . Erosive gastritis    EGD 6/10  . Hypothyroidism   . Intermittent claudication ([place])   . Pulmonary hypertension (HCC)    mild  . Shortness of breath   . Sinus tachycardia  persistent. History of negative Holter monitor. Normal LVF   . Streptococcal pneumonia [facility])     Past Surgical History:  Procedure Laterality Date  . APPENDECTOMY    . CHOLECYSTECTOMY    . COLONOSCOPY  10/24/2011   Procedure: COLONOSCOPY;  Surgeon: Rogene Houston, MD;  Location[facility];  Service: Endoscopy;  Laterality: N/A;  200  . PARTIAL HYSTERECTOMY    . THUMB ARTHROSCOPY      Family History  Problem Relation Age of Onset  . Cancer Unknown   . Stroke Unknown     Social History   Socioeconomic  History  . Marital status: Married    Spouse name: Not on file  . Number of children: Not on file  . Years of education: Not on file  . Highest education level: Not on file  Occupational History  . Not on file  Tobacco Use  . Smoking status: Former Smoker    Quit date: 02/19/2008    Years since quitting: 11.3  . Smokeless tobacco: Never Used  . Tobacco comment: Quit 3 yrs ago  Substance and Sexual Activity  . Alcohol use: No  . Drug use: No  . Sexual activity: Not on file  Other Topics Concern  . Not on file  Social History Narrative  . Not on file   Social Determinants of Health   Financial Resource Strain:   . Difficulty of Paying Living Expenses:   Food Insecurity:   . Worried About Charity fundraiser in the Last Year:   . Arboriculturist in the Last Year:   Transportation Needs:   . Film/video editor (Medical):   Marland Kitchen Lack of Transportation (Non-Medical):   Physical Activity:   . Days of Exercise per Week:   . Minutes of Exercise per Session:   Stress:   . Feeling of Stress :   Social Connections:   . Frequency of Communication with Friends and Family:   . Frequency of Social Gatherings with Friends and Family:   . Attends Religious Services:   . Active Member of Clubs or Organizations:   . Attends Archivist Meetings:   Marland Kitchen Marital Status:   Intimate Partner Violence:   . Fear of Current or Ex-Partner:   . Emotionally Abused:   Marland Kitchen Physically Abused:   . Sexually Abused:     Review of Systems  Constitutional: Positive for fatigue.  Eyes: Negative.   Respiratory: Positive for shortness of breath.     Vitals:   07/06/19 1514  BP: 130/80  Pulse: 83  Temp: (!) 97.3 F (36.3 C)  SpO2: 99%     Physical Exam  Constitutional: She is oriented to person, place, and time. She appears well-developed and well-nourished. No distress.  HENT:  Head: Normocephalic and atraumatic.  Eyes: Pupils are equal, round, and reactive to light. Conjunctivae  are normal. Right eye exhibits no discharge. Left eye exhibits no discharge.  Neck: No tracheal deviation present. No thyromegaly present.  Cardiovascular: Normal rate and regular rhythm.  Pulmonary/Chest: Effort normal. No respiratory distress. She has no wheezes. She has no rales.  Very poor air movement bilaterally  Abdominal: Soft.  Musculoskeletal:        General: No edema. Normal range of motion.     Cervical back: Normal range of motion and neck supple.  Neurological: She is alert and oriented to person, place, and time.  Skin: Skin is warm and dry. No erythema.  Psychiatric: She has a  normal mood and affect.     Data Reviewed: Chest x-ray performed today shows evidence of emphysema  CT scan from 2009 in 2006 reviewed showing some evidence of emphysema  Assessment:  Probable severe obstructive lung disease -Continue bronchodilators -She uses nebulizations with DuoNeb -We will add Pulmicort  Unable to use puffers-she feels it settles on her tongue almost every time when she uses Advair, not able to generate enough pressure to suck the medications and  Hypoxemic respiratory failure, chronic respiratory failure -Continue oxygen supplementation  Plan/Recommendations: Obtain echocardiogram  Importance of graded exercises discussed  Consider pulmonary function tests-does not feel she is able to do it, did poorly previously  I will see her in about 2 months Encouraged to call with any significant concerns    Sherrilyn Rist MD [facility] 07/06/2019, 5:09 PM  CC: Glenda Chroman, MD

## 2019-07-07 ENCOUNTER — Telehealth: Payer: Self-pay | Admitting: Pulmonary Disease

## 2019-07-07 NOTE — Telephone Encounter (Signed)
Called [facility], spoke with [place].  Patient allergies reviewed.  Duoneb and pulmicort instructions given. Nothing further at this time.  Per Dr. Ander Slade 07/06/19- Continue DuoNeb up to 4 times a day Add Pulmicort twice a day  Allergies   New allergies from outside sources are available for reconciliation    Amoxicillin  Cetirizine Hcl  Ciprofloxacin  Fexofenadine  Loratadine  Other  Oxycodone-acetaminophen  Povidone-iodine  Propoxyphene N-acetaminophen  Sulfamethoxazole

## 2019-07-14 ENCOUNTER — Ambulatory Visit (INDEPENDENT_AMBULATORY_CARE_PROVIDER_SITE_OTHER): Payer: Medicare Other

## 2019-07-14 ENCOUNTER — Other Ambulatory Visit: Payer: Self-pay

## 2019-07-14 DIAGNOSIS — R0602 Shortness of breath: Secondary | ICD-10-CM | POA: Diagnosis not present

## 2019-07-17 DIAGNOSIS — Z23 Encounter for immunization: Secondary | ICD-10-CM | POA: Diagnosis not present

## 2019-07-19 DIAGNOSIS — J441 Chronic obstructive pulmonary disease with (acute) exacerbation: Secondary | ICD-10-CM | POA: Diagnosis not present

## 2019-07-22 DIAGNOSIS — E039 Hypothyroidism, unspecified: Secondary | ICD-10-CM | POA: Diagnosis not present

## 2019-07-22 DIAGNOSIS — R0602 Shortness of breath: Secondary | ICD-10-CM | POA: Diagnosis not present

## 2019-08-19 DIAGNOSIS — J441 Chronic obstructive pulmonary disease with (acute) exacerbation: Secondary | ICD-10-CM | POA: Diagnosis not present

## 2019-08-24 DIAGNOSIS — Z299 Encounter for prophylactic measures, unspecified: Secondary | ICD-10-CM | POA: Diagnosis not present

## 2019-08-24 DIAGNOSIS — E039 Hypothyroidism, unspecified: Secondary | ICD-10-CM | POA: Diagnosis not present

## 2019-08-24 DIAGNOSIS — J449 Chronic obstructive pulmonary disease, unspecified: Secondary | ICD-10-CM | POA: Diagnosis not present

## 2019-08-28 DIAGNOSIS — J449 Chronic obstructive pulmonary disease, unspecified: Secondary | ICD-10-CM | POA: Diagnosis not present

## 2019-09-16 ENCOUNTER — Other Ambulatory Visit: Payer: Self-pay | Admitting: Pulmonary Disease

## 2019-09-18 DIAGNOSIS — J441 Chronic obstructive pulmonary disease with (acute) exacerbation: Secondary | ICD-10-CM | POA: Diagnosis not present

## 2019-10-14 DIAGNOSIS — J440 Chronic obstructive pulmonary disease with acute lower respiratory infection: Secondary | ICD-10-CM | POA: Diagnosis not present

## 2019-10-14 DIAGNOSIS — Z299 Encounter for prophylactic measures, unspecified: Secondary | ICD-10-CM | POA: Diagnosis not present

## 2019-10-14 DIAGNOSIS — J209 Acute bronchitis, unspecified: Secondary | ICD-10-CM | POA: Diagnosis not present

## 2019-10-19 DIAGNOSIS — J441 Chronic obstructive pulmonary disease with (acute) exacerbation: Secondary | ICD-10-CM | POA: Diagnosis not present

## 2019-10-21 DIAGNOSIS — J449 Chronic obstructive pulmonary disease, unspecified: Secondary | ICD-10-CM | POA: Diagnosis not present

## 2019-10-21 DIAGNOSIS — E039 Hypothyroidism, unspecified: Secondary | ICD-10-CM | POA: Diagnosis not present

## 2019-11-06 ENCOUNTER — Telehealth: Payer: Self-pay | Admitting: Pulmonary Disease

## 2019-11-06 NOTE — Telephone Encounter (Signed)
Called and spoke with pt as well as her nurse aide. Pt is needing a refill of her albuterol neb sol to be sent to [facility] as a 90-day supply.  Pt does not have albuterol neb sol listed on her med list. Pt currently has duoneb sol as well as pulmicort sol.  Dr.  Jenetta Downer, please advise if you are okay with [place] sending an Rx for albuterol sol to pharmacy for pt.

## 2019-11-09 MED ORDER — ALBUTEROL SULFATE (2.5 MG/3ML) 0.083% IN NEBU: 2.5000 mg | INHALATION_SOLUTION | RESPIRATORY_TRACT | 3 refills | Status: DC | PRN

## 2019-11-09 NOTE — Telephone Encounter (Signed)
Okay to send albuterol

## 2019-11-09 NOTE — Telephone Encounter (Signed)
Rx for albuterol neb sol has been sent to [facility] for pt. Attempted to call pt but unable to reach. Left pt a detailed message letting her know that the Rx was sent to pharmacy for her. Nothing further needed.

## 2019-11-18 DIAGNOSIS — J441 Chronic obstructive pulmonary disease with (acute) exacerbation: Secondary | ICD-10-CM | POA: Diagnosis not present

## 2019-11-20 DIAGNOSIS — E039 Hypothyroidism, unspecified: Secondary | ICD-10-CM | POA: Diagnosis not present

## 2019-11-20 DIAGNOSIS — J449 Chronic obstructive pulmonary disease, unspecified: Secondary | ICD-10-CM | POA: Diagnosis not present

## 2019-11-20 DIAGNOSIS — Z299 Encounter for prophylactic measures, unspecified: Secondary | ICD-10-CM | POA: Diagnosis not present

## 2019-12-18 ENCOUNTER — Ambulatory Visit (INDEPENDENT_AMBULATORY_CARE_PROVIDER_SITE_OTHER): Payer: Medicare Other | Admitting: Pulmonary Disease

## 2019-12-18 ENCOUNTER — Encounter: Payer: Self-pay | Admitting: Pulmonary Disease

## 2019-12-18 ENCOUNTER — Other Ambulatory Visit: Payer: Self-pay

## 2019-12-18 VITALS — BP 132/76 | HR 109 | Temp 97.8°F | Ht 62.0 in | Wt 150.0 lb

## 2019-12-18 DIAGNOSIS — R0602 Shortness of breath: Secondary | ICD-10-CM | POA: Diagnosis not present

## 2019-12-18 DIAGNOSIS — J431 Panlobular emphysema: Secondary | ICD-10-CM

## 2019-12-18 NOTE — Progress Notes (Signed)
Lauren Mclaughlin    [id number]    May 30, 1950  Primary Care Physician:Vyas, Costella Hatcher, MD  Referring Physician: Glenda Chroman, MD [address] [place],  [place] [address]  Chief complaint:   In for follow-up for obstructive lung disease  HPI: Diagnosed with obstructive lung disease about 2010 She is oxygen dependent Limited with activities Wheelchair-bound today  Third of a pack a day smoker, shortness of breath with mild to moderate exertion  Taking 50 steps to be challenging Going up steps is challenging  Unable to do inhalers secondary to not being able to generate enough inhalation pressure--has been on nebulizer with Pulmicort and albuterol Symptoms are progressive  No PFT on record, attempted PFT in the past and could not do it  Denies family history of obstructive lung disease Denies any other significant health problems She does have anxiety Hypothyroidism  No pets No pertinent occupational history   Outpatient Encounter Medications as of 12/18/2019  Medication Sig  . albuterol (PROVENTIL) (2.5 MG/3ML) 0.083% nebulizer solution Take 3 mLs (2.5 mg total) by nebulization every 4 (four) hours as needed for wheezing or shortness of breath.  . ALPRAZolam (XANAX) 0.5 MG tablet Take 0.5 mg by mouth 3 (three) times daily as needed. For anxiety  . B Complex Vitamins (B COMPLEX-B12) TABS Take 1 tablet by mouth daily.    . budesonide (PULMICORT) 0.5 MG/2ML nebulizer solution USE 2 ML (0.5 MG) VIA NEBULIZER TWICE A DAY  . Cholecalciferol (VITAMIN D3) 1000 UNITS CAPS Take 1 capsule by mouth daily.    . Fluticasone-Salmeterol (ADVAIR) 250-50 MCG/DOSE AEPB Inhale 1 puff into the lungs every 12 (twelve) hours.    Marland Kitchen ipratropium-albuterol (DUONEB) 0.5-2.5 (3) MG/3ML SOLN Take 3 mLs by nebulization 4 (four) times daily as needed.  Marland Kitchen levothyroxine (SYNTHROID, LEVOTHROID) 50 MCG tablet Take 50 mcg by mouth daily.  . montelukast (SINGULAIR) 10 MG tablet Take 10 mg by mouth at  bedtime.    . Multiple Vitamin (MULTI-VITAMIN PO) Take by mouth daily.  . [DISCONTINUED] DULoxetine (CYMBALTA) 60 MG capsule Take 60 mg by mouth daily.    . [DISCONTINUED] omeprazole (PRILOSEC) 20 MG capsule Take 20 mg by mouth daily.     No facility-administered encounter medications on file as of 12/18/2019.    Allergies as of 12/18/2019 - Review Complete 12/18/2019  Allergen Reaction Noted  . Amoxicillin  12/23/2007  . Cetirizine hcl  12/23/2007  . Ciprofloxacin  12/23/2007  . Fexofenadine  12/23/2007  . Loratadine  12/23/2007  . Other  10/12/2011  . Oxycodone-acetaminophen    . Povidone-iodine  12/23/2007  . Propoxyphene n-acetaminophen  12/23/2007  . Sulfamethoxazole  12/23/2007    Past Medical History:  Diagnosis Date  . Atypical chest pain    low risk pharmacologic imaging study, 7/09  . Cellulitis of buttock, right   . COPD (chronic obstructive pulmonary disease) ([place])   . 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 history of tobacco  . Dysrhythmia   . Erosive gastritis    EGD 6/10  . Hypothyroidism   . Intermittent claudication ([place])   . Pulmonary hypertension (HCC)    mild  . Shortness of breath   . Sinus tachycardia    persistent. History of negative Holter monitor. Normal LVF   . Streptococcal pneumonia [facility])     Past Surgical History:  Procedure Laterality Date  . APPENDECTOMY    . CHOLECYSTECTOMY    . COLONOSCOPY  10/24/2011  Procedure: COLONOSCOPY;  Surgeon: Rogene Houston, MD;  [facility];  Service: Endoscopy;  Laterality: N/A;  200  . PARTIAL HYSTERECTOMY    . THUMB ARTHROSCOPY      Family History  Problem Relation Age of Onset  . Cancer Other   . Stroke Other     Social History   Socioeconomic History  . Marital status: Married    Spouse name: Not on file  . Number of children: Not on file  . Years of education: Not on file  . Highest education level: Not on file  Occupational History  . Not on file  Tobacco Use  .  Smoking status: Former Smoker    Packs/day: 0.25    Years: 30.00    Pack years: 7.50    Types: Cigarettes    Quit date: 02/19/2008    Years since quitting: 11.8  . Smokeless tobacco: Never Used  . Tobacco comment: Quit 3 yrs ago  Substance and Sexual Activity  . Alcohol use: No  . Drug use: No  . Sexual activity: Not on file  Other Topics Concern  . Not on file  Social History Narrative  . Not on file   Social Determinants of Health   Financial Resource Strain:   . Difficulty of Paying Living Expenses: Not on file  Food Insecurity:   . Worried About Charity fundraiser in the Last Year: Not on file  . Ran Out of Food in the Last Year: Not on file  Transportation Needs:   . Lack of Transportation (Medical): Not on file  . Lack of Transportation (Non-Medical): Not on file  Physical Activity:   . Days of Exercise per Week: Not on file  . Minutes of Exercise per Session: Not on file  Stress:   . Feeling of Stress : Not on file  Social Connections:   . Frequency of Communication with Friends and Family: Not on file  . Frequency of Social Gatherings with Friends and Family: Not on file  . Attends Religious Services: Not on file  . Active Member of Clubs or Organizations: Not on file  . Attends Archivist Meetings: Not on file  . Marital Status: Not on file  Intimate Partner Violence:   . Fear of Current or Ex-Partner: Not on file  . Emotionally Abused: Not on file  . Physically Abused: Not on file  . Sexually Abused: Not on file    Review of Systems  Constitutional: Positive for fatigue.  Eyes: Negative.   Respiratory: Positive for shortness of breath. Negative for cough.     Vitals:   12/18/19 1329  BP: 132/76  Pulse: (!) 109  Temp: 97.8 F (36.6 C)  SpO2: 100%     Physical Exam Constitutional:      General: She is not in acute distress.    Appearance: She is well-developed.  HENT:     Head: Normocephalic and atraumatic.  Eyes:     Pupils:  Pupils are equal, round, and reactive to light.  Neck:     Thyroid: No thyromegaly.     Trachea: No tracheal deviation.  Cardiovascular:     Rate and Rhythm: Normal rate and regular rhythm.  Pulmonary:     Effort: Pulmonary effort is normal. No respiratory distress.     Breath sounds: No stridor. No wheezing, rhonchi or rales.  Abdominal:     Palpations: Abdomen is soft.  Musculoskeletal:     Cervical back: Normal range of  motion and neck supple.  Neurological:     Mental Status: She is alert.      Data Reviewed: Recent chest x-ray with evidence of emphysema  CT scan from 2009 in 2006 reviewed showing some evidence of emphysema  Assessment:  Probable severe obstructive lung disease -We will continue bronchodilator treatments with albuterol and Pulmicort -Discussed about need for other maintenance nebulization -[place] does not appear to be covered for patient  Unable to use puffers-she feels it settles on her tongue almost every time when she uses Advair, not able to generate enough pressure to suck the medications and  Hypoxemic respiratory failure, chronic respiratory failure -Continue oxygen supplementation -Encouraged to try and keep oxygen above 89%  Plan/Recommendations:  Importance of graded exercises discussed  Continue nebulization treatments  I will see her in about 3 months  Encouraged to call with any significant concerns    Sherrilyn Rist MD [facility] 12/18/2019, 2:04 PM  CC: Glenda Chroman, MD

## 2019-12-18 NOTE — Patient Instructions (Signed)
Continue inhalers  See you in 3 months.

## 2019-12-19 DIAGNOSIS — J441 Chronic obstructive pulmonary disease with (acute) exacerbation: Secondary | ICD-10-CM | POA: Diagnosis not present

## 2020-01-19 DIAGNOSIS — J441 Chronic obstructive pulmonary disease with (acute) exacerbation: Secondary | ICD-10-CM | POA: Diagnosis not present

## 2020-01-21 DIAGNOSIS — E039 Hypothyroidism, unspecified: Secondary | ICD-10-CM | POA: Diagnosis not present

## 2020-01-21 DIAGNOSIS — J449 Chronic obstructive pulmonary disease, unspecified: Secondary | ICD-10-CM | POA: Diagnosis not present

## 2020-01-29 DIAGNOSIS — Z299 Encounter for prophylactic measures, unspecified: Secondary | ICD-10-CM | POA: Diagnosis not present

## 2020-01-29 DIAGNOSIS — J449 Chronic obstructive pulmonary disease, unspecified: Secondary | ICD-10-CM | POA: Diagnosis not present

## 2020-01-29 DIAGNOSIS — R609 Edema, unspecified: Secondary | ICD-10-CM | POA: Diagnosis not present

## 2020-02-18 DIAGNOSIS — J441 Chronic obstructive pulmonary disease with (acute) exacerbation: Secondary | ICD-10-CM | POA: Diagnosis not present

## 2020-02-19 DIAGNOSIS — E039 Hypothyroidism, unspecified: Secondary | ICD-10-CM | POA: Diagnosis not present

## 2020-02-19 DIAGNOSIS — J449 Chronic obstructive pulmonary disease, unspecified: Secondary | ICD-10-CM | POA: Diagnosis not present

## 2020-03-07 ENCOUNTER — Telehealth: Payer: Self-pay | Admitting: Pulmonary Disease

## 2020-03-07 MED ORDER — IPRATROPIUM-ALBUTEROL 0.5-2.5 (3) MG/3ML IN SOLN: 3.0000 mL | Freq: Four times a day (QID) | RESPIRATORY_TRACT | 3 refills | Status: AC | PRN

## 2020-03-07 MED ORDER — BUDESONIDE 0.5 MG/2ML IN SUSP: RESPIRATORY_TRACT | 3 refills | Status: AC

## 2020-03-07 NOTE — Telephone Encounter (Signed)
Spoke with pt and advised that refills for Budesonide and Duoneb were sent to pharmacy. Nothing further needed at this time.

## 2020-03-20 DIAGNOSIS — J441 Chronic obstructive pulmonary disease with (acute) exacerbation: Secondary | ICD-10-CM | POA: Diagnosis not present

## 2020-03-22 DIAGNOSIS — E039 Hypothyroidism, unspecified: Secondary | ICD-10-CM | POA: Diagnosis not present

## 2020-03-22 DIAGNOSIS — R0600 Dyspnea, unspecified: Secondary | ICD-10-CM | POA: Diagnosis not present

## 2020-04-06 DIAGNOSIS — Z6822 Body mass index (BMI) 22.0-22.9, adult: Secondary | ICD-10-CM | POA: Diagnosis not present

## 2020-04-06 DIAGNOSIS — J449 Chronic obstructive pulmonary disease, unspecified: Secondary | ICD-10-CM | POA: Diagnosis not present

## 2020-04-06 DIAGNOSIS — Z789 Other specified health status: Secondary | ICD-10-CM | POA: Diagnosis not present

## 2020-04-06 DIAGNOSIS — E039 Hypothyroidism, unspecified: Secondary | ICD-10-CM | POA: Diagnosis not present

## 2020-04-06 DIAGNOSIS — Z23 Encounter for immunization: Secondary | ICD-10-CM | POA: Diagnosis not present

## 2020-04-06 DIAGNOSIS — R609 Edema, unspecified: Secondary | ICD-10-CM | POA: Diagnosis not present

## 2020-04-06 DIAGNOSIS — Z299 Encounter for prophylactic measures, unspecified: Secondary | ICD-10-CM | POA: Diagnosis not present

## 2020-04-19 DIAGNOSIS — J441 Chronic obstructive pulmonary disease with (acute) exacerbation: Secondary | ICD-10-CM | POA: Diagnosis not present

## 2020-04-22 DIAGNOSIS — R0600 Dyspnea, unspecified: Secondary | ICD-10-CM | POA: Diagnosis not present

## 2020-04-22 DIAGNOSIS — E039 Hypothyroidism, unspecified: Secondary | ICD-10-CM | POA: Diagnosis not present

## 2020-06-20 DIAGNOSIS — E039 Hypothyroidism, unspecified: Secondary | ICD-10-CM | POA: Diagnosis not present

## 2020-06-20 DIAGNOSIS — R0600 Dyspnea, unspecified: Secondary | ICD-10-CM | POA: Diagnosis not present

## 2020-07-20 DIAGNOSIS — I10 Essential (primary) hypertension: Secondary | ICD-10-CM | POA: Diagnosis not present

## 2020-07-20 DIAGNOSIS — E039 Hypothyroidism, unspecified: Secondary | ICD-10-CM | POA: Diagnosis not present

## 2020-08-04 DIAGNOSIS — Z299 Encounter for prophylactic measures, unspecified: Secondary | ICD-10-CM | POA: Diagnosis not present

## 2020-08-04 DIAGNOSIS — J449 Chronic obstructive pulmonary disease, unspecified: Secondary | ICD-10-CM | POA: Diagnosis not present

## 2020-08-31 DIAGNOSIS — Z6823 Body mass index (BMI) 23.0-23.9, adult: Secondary | ICD-10-CM | POA: Diagnosis not present

## 2020-08-31 DIAGNOSIS — Z79899 Other long term (current) drug therapy: Secondary | ICD-10-CM | POA: Diagnosis not present

## 2020-08-31 DIAGNOSIS — Z0000 Encounter for general adult medical examination without abnormal findings: Secondary | ICD-10-CM | POA: Diagnosis not present

## 2020-08-31 DIAGNOSIS — Z299 Encounter for prophylactic measures, unspecified: Secondary | ICD-10-CM | POA: Diagnosis not present

## 2020-08-31 DIAGNOSIS — Z7189 Other specified counseling: Secondary | ICD-10-CM | POA: Diagnosis not present

## 2020-08-31 DIAGNOSIS — E559 Vitamin D deficiency, unspecified: Secondary | ICD-10-CM | POA: Diagnosis not present

## 2020-08-31 DIAGNOSIS — R5383 Other fatigue: Secondary | ICD-10-CM | POA: Diagnosis not present

## 2020-08-31 DIAGNOSIS — Z789 Other specified health status: Secondary | ICD-10-CM | POA: Diagnosis not present

## 2020-08-31 DIAGNOSIS — E039 Hypothyroidism, unspecified: Secondary | ICD-10-CM | POA: Diagnosis not present

## 2020-08-31 DIAGNOSIS — E7800 Pure hypercholesterolemia, unspecified: Secondary | ICD-10-CM | POA: Diagnosis not present

## 2020-10-11 DIAGNOSIS — J9611 Chronic respiratory failure with hypoxia: Secondary | ICD-10-CM | POA: Diagnosis not present

## 2020-10-11 DIAGNOSIS — Z299 Encounter for prophylactic measures, unspecified: Secondary | ICD-10-CM | POA: Diagnosis not present

## 2020-10-11 DIAGNOSIS — R609 Edema, unspecified: Secondary | ICD-10-CM | POA: Diagnosis not present

## 2020-10-20 DIAGNOSIS — I10 Essential (primary) hypertension: Secondary | ICD-10-CM | POA: Diagnosis not present

## 2020-10-20 DIAGNOSIS — R600 Localized edema: Secondary | ICD-10-CM | POA: Diagnosis not present

## 2020-10-31 DIAGNOSIS — R600 Localized edema: Secondary | ICD-10-CM | POA: Diagnosis not present

## 2020-12-21 DIAGNOSIS — I10 Essential (primary) hypertension: Secondary | ICD-10-CM | POA: Diagnosis not present

## 2020-12-21 DIAGNOSIS — R600 Localized edema: Secondary | ICD-10-CM | POA: Diagnosis not present

## 2021-03-17 DIAGNOSIS — R609 Edema, unspecified: Secondary | ICD-10-CM | POA: Diagnosis not present

## 2021-03-17 DIAGNOSIS — Z6824 Body mass index (BMI) 24.0-24.9, adult: Secondary | ICD-10-CM | POA: Diagnosis not present

## 2021-03-22 DIAGNOSIS — N1831 Chronic kidney disease, stage 3a: Secondary | ICD-10-CM | POA: Diagnosis not present

## 2021-03-22 DIAGNOSIS — E7800 Pure hypercholesterolemia, unspecified: Secondary | ICD-10-CM | POA: Diagnosis not present

## 2021-03-22 DIAGNOSIS — J449 Chronic obstructive pulmonary disease, unspecified: Secondary | ICD-10-CM | POA: Diagnosis not present

## 2021-03-22 DIAGNOSIS — J9611 Chronic respiratory failure with hypoxia: Secondary | ICD-10-CM | POA: Diagnosis not present

## 2021-04-12 DIAGNOSIS — Z6824 Body mass index (BMI) 24.0-24.9, adult: Secondary | ICD-10-CM | POA: Diagnosis not present

## 2021-04-12 DIAGNOSIS — R609 Edema, unspecified: Secondary | ICD-10-CM | POA: Diagnosis not present

## 2021-04-12 DIAGNOSIS — R350 Frequency of micturition: Secondary | ICD-10-CM | POA: Diagnosis not present

## 2021-04-12 DIAGNOSIS — R5383 Other fatigue: Secondary | ICD-10-CM | POA: Diagnosis not present

## 2021-04-12 DIAGNOSIS — Z299 Encounter for prophylactic measures, unspecified: Secondary | ICD-10-CM | POA: Diagnosis not present

## 2021-04-27 DIAGNOSIS — J209 Acute bronchitis, unspecified: Secondary | ICD-10-CM | POA: Diagnosis not present

## 2021-04-27 DIAGNOSIS — J440 Chronic obstructive pulmonary disease with acute lower respiratory infection: Secondary | ICD-10-CM | POA: Diagnosis not present

## 2021-04-27 DIAGNOSIS — Z299 Encounter for prophylactic measures, unspecified: Secondary | ICD-10-CM | POA: Diagnosis not present

## 2021-04-27 DIAGNOSIS — I700 Atherosclerosis of aorta: Secondary | ICD-10-CM | POA: Diagnosis not present

## 2021-04-27 DIAGNOSIS — R609 Edema, unspecified: Secondary | ICD-10-CM | POA: Diagnosis not present

## 2021-05-31 DIAGNOSIS — J449 Chronic obstructive pulmonary disease, unspecified: Secondary | ICD-10-CM | POA: Diagnosis not present

## 2021-06-20 DIAGNOSIS — J449 Chronic obstructive pulmonary disease, unspecified: Secondary | ICD-10-CM | POA: Diagnosis not present

## 2021-06-20 DIAGNOSIS — J9611 Chronic respiratory failure with hypoxia: Secondary | ICD-10-CM | POA: Diagnosis not present

## 2021-06-20 DIAGNOSIS — E7800 Pure hypercholesterolemia, unspecified: Secondary | ICD-10-CM | POA: Diagnosis not present

## 2021-06-20 DIAGNOSIS — N1831 Chronic kidney disease, stage 3a: Secondary | ICD-10-CM | POA: Diagnosis not present

## 2021-06-28 DIAGNOSIS — J449 Chronic obstructive pulmonary disease, unspecified: Secondary | ICD-10-CM | POA: Diagnosis not present

## 2021-07-05 ENCOUNTER — Other Ambulatory Visit: Payer: Self-pay

## 2021-07-05 ENCOUNTER — Encounter (HOSPITAL_COMMUNITY): Payer: Self-pay | Admitting: *Deleted

## 2021-07-05 ENCOUNTER — Emergency Department (HOSPITAL_COMMUNITY): Payer: Medicare Other

## 2021-07-05 ENCOUNTER — Inpatient Hospital Stay (HOSPITAL_COMMUNITY)
Admission: EM | Admit: 2021-07-05 | Discharge: 2021-07-08 | DRG: 190 | Disposition: A | Discharge status: Home Health | Payer: Medicare Other | Attending: Internal Medicine | Admitting: Internal Medicine

## 2021-07-05 DIAGNOSIS — Z9981 Dependence on supplemental oxygen: Secondary | ICD-10-CM

## 2021-07-05 DIAGNOSIS — Z79899 Other long term (current) drug therapy: Secondary | ICD-10-CM

## 2021-07-05 DIAGNOSIS — Z79890 Hormone replacement therapy (postmenopausal): Secondary | ICD-10-CM

## 2021-07-05 DIAGNOSIS — J441 Chronic obstructive pulmonary disease with (acute) exacerbation: Secondary | ICD-10-CM | POA: Diagnosis not present

## 2021-07-05 DIAGNOSIS — I499 Cardiac arrhythmia, unspecified: Secondary | ICD-10-CM | POA: Diagnosis not present

## 2021-07-05 DIAGNOSIS — R0602 Shortness of breath: Principal | ICD-10-CM

## 2021-07-05 DIAGNOSIS — Z888 Allergy status to other drugs, medicaments and biological substances status: Secondary | ICD-10-CM

## 2021-07-05 DIAGNOSIS — J9611 Chronic respiratory failure with hypoxia: Secondary | ICD-10-CM | POA: Diagnosis not present

## 2021-07-05 DIAGNOSIS — J440 Chronic obstructive pulmonary disease with acute lower respiratory infection: Secondary | ICD-10-CM | POA: Diagnosis present

## 2021-07-05 DIAGNOSIS — Z87891 Personal history of nicotine dependence: Secondary | ICD-10-CM

## 2021-07-05 DIAGNOSIS — Z7951 Long term (current) use of inhaled steroids: Secondary | ICD-10-CM | POA: Diagnosis not present

## 2021-07-05 DIAGNOSIS — I272 Pulmonary hypertension, unspecified: Secondary | ICD-10-CM | POA: Diagnosis present

## 2021-07-05 DIAGNOSIS — J189 Pneumonia, unspecified organism: Secondary | ICD-10-CM | POA: Diagnosis present

## 2021-07-05 DIAGNOSIS — Z881 Allergy status to other antibiotic agents status: Secondary | ICD-10-CM

## 2021-07-05 DIAGNOSIS — J439 Emphysema, unspecified: Secondary | ICD-10-CM | POA: Diagnosis not present

## 2021-07-05 DIAGNOSIS — Z20822 Contact with and (suspected) exposure to covid-19: Secondary | ICD-10-CM | POA: Diagnosis present

## 2021-07-05 DIAGNOSIS — E039 Hypothyroidism, unspecified: Secondary | ICD-10-CM

## 2021-07-05 DIAGNOSIS — Z90711 Acquired absence of uterus with remaining cervical stump: Secondary | ICD-10-CM

## 2021-07-05 DIAGNOSIS — R6889 Other general symptoms and signs: Secondary | ICD-10-CM | POA: Diagnosis not present

## 2021-07-05 DIAGNOSIS — Z9049 Acquired absence of other specified parts of digestive tract: Secondary | ICD-10-CM | POA: Diagnosis not present

## 2021-07-05 DIAGNOSIS — Z743 Need for continuous supervision: Secondary | ICD-10-CM | POA: Diagnosis not present

## 2021-07-05 LAB — BASIC METABOLIC PANEL
Anion gap: 9 (ref 5–15)
BUN: 16 mg/dL (ref 8–23)
CO2: 34 mmol/L — ABNORMAL HIGH (ref 22–32)
Calcium: 8.9 mg/dL (ref 8.9–10.3)
Chloride: 94 mmol/L — ABNORMAL LOW (ref 98–111)
Creatinine, Ser: 0.86 mg/dL (ref 0.44–1.00)
GFR, Estimated: >60 mL/min (ref >60)
Glucose, Bld: 95 mg/dL (ref 70–99)
Potassium: 3.9 mmol/L (ref 3.5–5.1)
Sodium: 137 mmol/L (ref 135–145)

## 2021-07-05 LAB — EXPECTORATED SPUTUM ASSESSMENT W GRAM STAIN, RFLX TO RESP C

## 2021-07-05 LAB — CBC
HCT: 35.7 % — ABNORMAL LOW (ref 36.0–46.0)
Hemoglobin: 10.7 g/dL — ABNORMAL LOW (ref 12.0–15.0)
MCH: 26.8 pg (ref 26.0–34.0)
MCHC: 30 g/dL (ref 30.0–36.0)
MCV: 89.5 fL (ref 80.0–100.0)
Platelets: 191 10*3/uL (ref 150–400)
RBC: 3.99 MIL/uL (ref 3.87–5.11)
RDW: 13.2 % (ref 11.5–15.5)
WBC: 4.7 10*3/uL (ref 4.0–10.5)
nRBC: 0 % (ref 0.0–0.2)

## 2021-07-05 LAB — RESP PANEL BY RT-PCR (FLU A&B, COVID) ARPGX2
Influenza A by PCR: NEGATIVE
Influenza B by PCR: NEGATIVE
SARS Coronavirus 2 by RT PCR: NEGATIVE

## 2021-07-05 LAB — BRAIN NATRIURETIC PEPTIDE: B Natriuretic Peptide: 47 pg/mL (ref 0.0–100.0)

## 2021-07-05 MED ORDER — ACETAMINOPHEN 650 MG RE SUPP: 650.0000 mg | Freq: Four times a day (QID) | RECTAL | Status: DC | PRN

## 2021-07-05 MED ORDER — B COMPLEX-C PO TABS
1.0000 | ORAL_TABLET | Freq: Every day | ORAL | Status: DC
  Administered 2021-07-06 – 2021-07-08 (×3): 1 via ORAL
  Filled 2021-07-05 (×3): qty 1

## 2021-07-05 MED ORDER — ONDANSETRON HCL 4 MG/2ML IJ SOLN: 4.0000 mg | Freq: Four times a day (QID) | INTRAMUSCULAR | Status: DC | PRN

## 2021-07-05 MED ORDER — ALBUTEROL SULFATE (2.5 MG/3ML) 0.083% IN NEBU: 2.5000 mg | INHALATION_SOLUTION | RESPIRATORY_TRACT | Status: DC | PRN

## 2021-07-05 MED ORDER — ENOXAPARIN SODIUM 40 MG/0.4ML IJ SOSY
40.0000 mg | PREFILLED_SYRINGE | INTRAMUSCULAR | Status: DC
  Administered 2021-07-05 – 2021-07-07 (×3): 40 mg via SUBCUTANEOUS
  Filled 2021-07-05 (×3): qty 0.4

## 2021-07-05 MED ORDER — METHYLPREDNISOLONE SODIUM SUCC 125 MG IJ SOLR
125.0000 mg | Freq: Once | INTRAMUSCULAR | Status: AC
  Administered 2021-07-05: 125 mg via INTRAVENOUS
  Filled 2021-07-05: qty 2

## 2021-07-05 MED ORDER — IPRATROPIUM-ALBUTEROL 0.5-2.5 (3) MG/3ML IN SOLN
3.0000 mL | Freq: Four times a day (QID) | RESPIRATORY_TRACT | Status: DC
  Administered 2021-07-06 (×4): 3 mL via RESPIRATORY_TRACT
  Filled 2021-07-05 (×4): qty 3

## 2021-07-05 MED ORDER — ALPRAZOLAM 0.5 MG PO TABS
0.5000 mg | ORAL_TABLET | Freq: Three times a day (TID) | ORAL | Status: DC | PRN
  Administered 2021-07-05 – 2021-07-07 (×5): 0.5 mg via ORAL
  Filled 2021-07-05 (×5): qty 1

## 2021-07-05 MED ORDER — LEVOTHYROXINE SODIUM 50 MCG PO TABS
50.0000 ug | ORAL_TABLET | Freq: Every day | ORAL | Status: DC
  Administered 2021-07-06 – 2021-07-08 (×3): 50 ug via ORAL
  Filled 2021-07-05 (×3): qty 1

## 2021-07-05 MED ORDER — AZITHROMYCIN 500 MG IV SOLR
500.0000 mg | INTRAVENOUS | Status: DC
Start: 2021-07-05 — End: 2021-07-08
  Administered 2021-07-05 – 2021-07-07 (×3): 500 mg via INTRAVENOUS
  Filled 2021-07-05 (×3): qty 5

## 2021-07-05 MED ORDER — ACETAMINOPHEN 325 MG PO TABS
650.0000 mg | ORAL_TABLET | Freq: Four times a day (QID) | ORAL | Status: DC | PRN
  Filled 2021-07-05: qty 2

## 2021-07-05 MED ORDER — B COMPLEX-B12 PO TABS: 1.0000 | ORAL_TABLET | Freq: Every day | ORAL | Status: DC

## 2021-07-05 MED ORDER — PREDNISONE 20 MG PO TABS
40.0000 mg | ORAL_TABLET | Freq: Every day | ORAL | Status: DC
  Filled 2021-07-05: qty 2

## 2021-07-05 MED ORDER — BUDESONIDE 0.5 MG/2ML IN SUSP
0.5000 mg | Freq: Two times a day (BID) | RESPIRATORY_TRACT | Status: DC
  Administered 2021-07-06 – 2021-07-08 (×5): 0.5 mg via RESPIRATORY_TRACT
  Filled 2021-07-05 (×4): qty 2

## 2021-07-05 MED ORDER — ONDANSETRON HCL 4 MG PO TABS: 4.0000 mg | ORAL_TABLET | Freq: Four times a day (QID) | ORAL | Status: DC | PRN

## 2021-07-05 MED ORDER — IPRATROPIUM-ALBUTEROL 0.5-2.5 (3) MG/3ML IN SOLN
3.0000 mL | Freq: Once | RESPIRATORY_TRACT | Status: AC
  Administered 2021-07-05: 3 mL via RESPIRATORY_TRACT
  Filled 2021-07-05: qty 3

## 2021-07-05 MED ORDER — VITAMIN D3 25 MCG (1000 UNIT) PO TABS
1000.0000 [IU] | ORAL_TABLET | Freq: Every day | ORAL | Status: DC
  Administered 2021-07-06 – 2021-07-08 (×3): 1000 [IU] via ORAL
  Filled 2021-07-05 (×9): qty 1

## 2021-07-05 MED ORDER — MAGNESIUM SULFATE 2 GM/50ML IV SOLN
2.0000 g | Freq: Once | INTRAVENOUS | Status: AC
  Administered 2021-07-05: 2 g via INTRAVENOUS
  Filled 2021-07-05: qty 50

## 2021-07-05 MED ORDER — SODIUM CHLORIDE 0.9 % IV SOLN
2.0000 g | INTRAVENOUS | Status: DC
  Administered 2021-07-05 – 2021-07-07 (×3): 2 g via INTRAVENOUS
  Filled 2021-07-05 (×3): qty 20

## 2021-07-05 MED ORDER — METHYLPREDNISOLONE SODIUM SUCC 125 MG IJ SOLR
125.0000 mg | Freq: Every day | INTRAMUSCULAR | Status: AC
  Administered 2021-07-06: 125 mg via INTRAVENOUS
  Filled 2021-07-05: qty 2

## 2021-07-05 NOTE — H&P (Signed)
? ?                                                                                                       [facility] H&P ? ? Patient Demographics:  ? ? Lauren Mclaughlin, is a 71 y.o. female  MRN: [id number]   DOB - 03/25/1951 ? ?Admit Date - 07/05/2021 ? ?Outpatient Primary MD for the patient is Glenda Chroman, MD ? ?Referring MD/NP/PA: Dr Vanita Panda ? ?Patient coming from: home ? ?Chief Complaint  ?Patient presents with  ? Shortness of Breath  ?  ? ? HPI:  ? ? Lauren Mclaughlin  is a 71 y.o. female, with past medical history of COPD, chronic hypoxic respiratory failure on 3 L nasal cannula at baseline, and hypothyroidism, she presents to ED secondary to complaints of shortness of breath, patient reports dyspnea ongoing for several days, increasing in severity, she is more dyspneic today with, and she developed hoarseness and lost her voice which prompted her to come to ED, she denies any fever, chest pain, chills, hemoptysis, she does report cough, nonproductive. ?-In ED patient was afebrile, she is good at her baseline 3 L nasal cannula, her chest x-ray significant for left lung opacity she was with significant wheezing on presentation this has significantly improved with DuoNebs and IV Solu-Medrol, [facility] hospitalist consulted to admit. ? ? ? Review of systems:  ?  ?In addition to the HPI above,  ? ? ?A full 10 point Review of Systems was done, except as stated above, all other Review of Systems were negative. ? ? ?With Past History of the following :  ? ? ?Past Medical History:  ?Diagnosis Date  ? Atypical chest pain   ? low risk pharmacologic imaging study, 7/09  ? Cellulitis of buttock, right   ? COPD (chronic obstructive pulmonary disease) ([place])   ? COPD, severe   ? on supplemental oxygen. history of tobacco  ? Dysrhythmia   ? Erosive gastritis   ? EGD 6/10  ? Hypothyroidism   ? Intermittent claudication (HCC)   ? Pulmonary hypertension ([place])   ? mild  ? Shortness of breath   ? Sinus tachycardia   ? persistent. History  of negative Holter monitor. Normal LVF   ? Streptococcal pneumonia ([place])   ?   ? ?Past Surgical History:  ?Procedure Laterality Date  ? APPENDECTOMY    ? CHOLECYSTECTOMY    ? COLONOSCOPY  10/24/2011  ? Procedure: COLONOSCOPY;  Surgeon: Rogene Houston, MD;  Location: [facility];  Service: Endoscopy;  Laterality: N/A;  200  ? PARTIAL HYSTERECTOMY    ? THUMB ARTHROSCOPY    ? ? ? ? Social History:  ? ?  ?Social History  ? ?Tobacco Use  ? Smoking status: Former  ?  Packs/day: 0.25  ?  Years: 30.00  ?  Pack years: 7.50  ?  Types: Cigarettes  ?  Quit date: 02/19/2008  ?  Years since quitting: 13.3  ? Smokeless tobacco: Never  ? Tobacco comments:  ?  Quit 3 yrs ago  ?Substance Use Topics  ?  Alcohol use: No  ? ? Family History :  ? ?  ?Family History  ?Problem Relation Age of Onset  ? Cancer Other   ? Stroke Other   ? ? ? ? Home Medications:  ? ?Prior to Admission medications   ?Medication Sig Start Date End Date Taking? Authorizing Provider  ?ALPRAZolam (XANAX) 0.5 MG tablet Take 0.5 mg by mouth 3 (three) times daily as needed. For anxiety   Yes [provider]  ?B Complex Vitamins (B COMPLEX-B12) TABS Take 1 tablet by mouth daily.     Yes [provider]  ?budesonide (PULMICORT) 0.5 MG/2ML nebulizer solution USE 2 ML (0.5 MG) VIA NEBULIZER TWICE A DAY 03/07/20  Yes Olalere, Adewale A, MD  ?Cholecalciferol (VITAMIN D3) 1000 UNITS CAPS Take 1 capsule by mouth daily.     Yes [provider]  ?ipratropium-albuterol (DUONEB) 0.5-2.5 (3) MG/3ML SOLN Take 3 mLs by nebulization 4 (four) times daily as needed. 03/07/20  Yes Olalere, Adewale A, MD  ?levothyroxine (SYNTHROID, LEVOTHROID) 50 MCG tablet Take 50 mcg by mouth daily.   Yes [provider]  ?Multiple Vitamin (MULTI-VITAMIN PO) Take 1 tablet by mouth daily.   Yes [provider]  ?albuterol (PROVENTIL) (2.5 MG/3ML) 0.083% nebulizer solution Take 3 mLs (2.5 mg total) by nebulization every 4 (four) hours as needed for wheezing or  shortness of breath. ?Patient not taking: Reported on 07/05/2021 11/09/19   Laurin Coder, MD  ?DULoxetine (CYMBALTA) 60 MG capsule Take 60 mg by mouth daily.    07/16/11  [provider]  ?omeprazole (PRILOSEC) 20 MG capsule Take 20 mg by mouth daily.    07/16/11  [provider]  ? ? ? Allergies:  ? ?  ?Allergies  ?Allergen Reactions  ? Amoxicillin   ?  REACTION: rash  ? Cetirizine Hcl   ?  REACTION: unspecified  ? Ciprofloxacin   ?  REACTION: Joint Pain  ? Fexofenadine   ?  REACTION: unspecified  ? Loratadine   ?  REACTION: unspecified  ? Other   ?  Pt states she has some other allergies but can not remember what they are at this time  ? Oxycodone-Acetaminophen   ?  REACTION: Itching, "goes nuts"  ? Povidone-Iodine   ?  REACTION: rash  ? Propoxyphene N-Acetaminophen   ?  REACTION: rash  ? Sulfamethoxazole   ?  REACTION: unspecified  ? ? ? Physical Exam:  ? ?Vitals ? ?Blood pressure (!) 153/80, pulse (!) 107, temperature 99.5 ?F (37.5 ?C), temperature source Oral, resp. rate (!) 28, SpO2 99 %. ? ? ?1. General well-developed female, laying in bed, no apparent distress ? ?2. Normal affect and insight, Not Suicidal or Homicidal, Awake Alert, Oriented X 3. ? ?3. No F.N deficits, ALL C.Nerves Intact, Strength 5/5 all 4 extremities, Sensation intact all 4 extremities, Plantars down going. ? ?4. Ears and Eyes appear Normal, Conjunctivae clear, PERRLA. Moist Oral Mucosa. ? ?5. Supple Neck, No JVD, No cervical lymphadenopathy appriciated, No Carotid Bruits. ? ?6. Symmetrical Chest wall movement, scattered wheezing and rhonchi ? ?7. RRR, No Gallops, Rubs or Murmurs, No Parasternal Heave. ? ?8. Positive Bowel Sounds, Abdomen Soft, No tenderness, No organomegaly appriciated,No rebound -guarding or rigidity. ? ?9.  No Cyanosis, Normal Skin Turgor, No Skin Rash or Bruise. ? ?10. Good muscle tone,  joints appear normal , no effusions, Normal ROM. ? ?11. No Palpable Lymph Nodes in Neck or Axillae ? ? ? ? Data  Review:  ? ?  CBC ?Recent Labs  ?Lab 07/05/21 ?1532  ?WBC 4.7  ?HGB 10.7*  ?HCT 35.7*  ?PLT 191  ?MCV 89.5  ?MCH 26.8  ?MCHC 30.0  ?RDW 13.2  ? ?------------------------------------------------------------------------------------------------------------------ ? ?Chemistries  ?Recent Labs  ?Lab 07/05/21 ?1532  ?NA 137  ?K 3.9  ?CL 94*  ?CO2 34*  ?GLUCOSE 95  ?BUN 16  ?CREATININE 0.86  ?CALCIUM 8.9  ? ?------------------------------------------------------------------------------------------------------------------ ?CrCl cannot be calculated (Unknown ideal weight.). ?------------------------------------------------------------------------------------------------------------------ ?No results for input(s): TSH, T4TOTAL, T3FREE, THYROIDAB in the last 72 hours. ? ?Invalid input(s): FREET3 ? ?Coagulation profile ?No results for input(s): INR, PROTIME in the last 168 hours. ?------------------------------------------------------------------------------------------------------------------- ?No results for input(s): DDIMER in the last 72 hours. ?------------------------------------------------------------------------------------------------------------------- ? ?Cardiac Enzymes ?No results for input(s): CKMB, TROPONINI, MYOGLOBIN in the last 168 hours. ? ?Invalid input(s): CK ?------------------------------------------------------------------------------------------------------------------ ?   ?Component Value Date/Time  ? BNP 47.0 07/05/2021 1532  ? ? ? ?--------------------------------------------------------------------------------------------------------------- ? ?Urinalysis ?No results found for: COLORURINE, APPEARANCEUR, [place], [place], [place], [place], [place], [place], PROTEINUR, UROBILINOGEN, NITRITE, LEUKOCYTESUR ? ?---------------------------------------------------------------------------------------------------------------- ? ? Imaging Results:  ? ? DG Chest Port 1 View ? ?Result Date: 07/05/2021 ?CLINICAL  DATA:  Shortness of breath. EXAM: PORTABLE CHEST 1 VIEW COMPARISON:  07/06/2019 FINDINGS: Cardiac silhouette and mediastinal contours are within normal limits with moderate calcification within the ao

## 2021-07-05 NOTE — ED Triage Notes (Signed)
Pt in from home via [facility] c/o SOB onset x 3 days worsening today, uses 3 L [place] at baseline with hx of COPD, denies CP, denies fever, reports productive cough with clear sputum, A&O x4 ?

## 2021-07-05 NOTE — ED Provider Notes (Signed)
?[facility] ?Provider Note ? ? ?CSN: [id number] ?Arrival date & time: 07/05/21  1255 ? ?  ? ?History ? ?Chief Complaint  ?Patient presents with  ? Shortness of Breath  ? ? ?Lauren Mclaughlin is a 71 y.o. female. ? ? ?Shortness of Breath ?Associated symptoms: no fever   ? ?Patient has a history of atypical chest pain, intermittent claudication, pneumonia, pulmonary hypertension, COPD who presents with complaints of shortness of breath.  Patient states the symptoms have been ongoing for several days.  They have been gradually increasing in severity.  Patient is feeling more and more short of breath.  She also lost her voice and is hoarse.  She denies any fevers.  No chest pain.  She has been trying her breathing treatments at home without relief. ?Patient is chronically on home oxygen ?Home Medications ?Prior to Admission medications   ?Medication Sig Start Date End Date Taking? Authorizing Provider  ?ALPRAZolam (XANAX) 0.5 MG tablet Take 0.5 mg by mouth 3 (three) times daily as needed. For anxiety   Yes [provider]  ?B Complex Vitamins (B COMPLEX-B12) TABS Take 1 tablet by mouth daily.     Yes [provider]  ?budesonide (PULMICORT) 0.5 MG/2ML nebulizer solution USE 2 ML (0.5 MG) VIA NEBULIZER TWICE A DAY 03/07/20  Yes Olalere, Adewale A, MD  ?Cholecalciferol (VITAMIN D3) 1000 UNITS CAPS Take 1 capsule by mouth daily.     Yes [provider]  ?ipratropium-albuterol (DUONEB) 0.5-2.5 (3) MG/3ML SOLN Take 3 mLs by nebulization 4 (four) times daily as needed. 03/07/20  Yes Olalere, Adewale A, MD  ?levothyroxine (SYNTHROID, LEVOTHROID) 50 MCG tablet Take 50 mcg by mouth daily.   Yes [provider]  ?Multiple Vitamin (MULTI-VITAMIN PO) Take 1 tablet by mouth daily.   Yes [provider]  ?albuterol (PROVENTIL) (2.5 MG/3ML) 0.083% nebulizer solution Take 3 mLs (2.5 mg total) by nebulization every 4 (four) hours as needed for wheezing or shortness of  breath. ?Patient not taking: Reported on 07/05/2021 11/09/19   Laurin Coder, MD  ?DULoxetine (CYMBALTA) 60 MG capsule Take 60 mg by mouth daily.    07/16/11  [provider]  ?omeprazole (PRILOSEC) 20 MG capsule Take 20 mg by mouth daily.    07/16/11  [provider]  ?   ? ?Allergies    ?Amoxicillin, Cetirizine hcl, Ciprofloxacin, Fexofenadine, Loratadine, Other, Oxycodone-acetaminophen, Povidone-iodine, Propoxyphene n-acetaminophen, and Sulfamethoxazole   ? ?Review of Systems   ?Review of Systems  ?Constitutional:  Negative for fever.  ?Respiratory:  Positive for shortness of breath.   ? ?Physical Exam ?Updated Vital Signs ?BP (!) 148/77   Pulse (!) 122   Temp 99.5 ?F (37.5 ?C) (Oral)   Resp (!) 24   SpO2 99%  ?Physical Exam ?Vitals and nursing note reviewed.  ?Constitutional:   ?   General: She is not in acute distress. ?   Appearance: She is well-developed.  ?HENT:  ?   Head: Normocephalic and atraumatic.  ?   Right Ear: External ear normal.  ?   Left Ear: External ear normal.  ?Eyes:  ?   General: No scleral icterus.    ?   Right eye: No discharge.     ?   Left eye: No discharge.  ?   Conjunctiva/sclera: Conjunctivae normal.  ?Neck:  ?   Trachea: No tracheal deviation.  ?Cardiovascular:  ?   Rate and Rhythm: Normal rate and regular rhythm.  ?Pulmonary:  ?   Effort:  Tachypnea present. No respiratory distress.  ?   Breath sounds: No stridor. Wheezing present. No rales.  ?Abdominal:  ?   General: Bowel sounds are normal. There is no distension.  ?   Palpations: Abdomen is soft.  ?   Tenderness: There is no abdominal tenderness. There is no guarding or rebound.  ?Musculoskeletal:     ?   General: No tenderness or deformity.  ?   Cervical back: Neck supple.  ?Skin: ?   General: Skin is warm and dry.  ?   Findings: No rash.  ?Neurological:  ?   General: No focal deficit present.  ?   Mental Status: She is alert.  ?   Cranial Nerves: No cranial nerve deficit (no facial droop, extraocular  movements intact, no slurred speech).  ?   Sensory: No sensory deficit.  ?   Motor: No abnormal muscle tone or seizure activity.  ?   Coordination: Coordination normal.  ?Psychiatric:     ?   Mood and Affect: Mood normal.  ? ? ?ED Results / Procedures / Treatments   ?Labs ?(all labs ordered are listed, but only abnormal results are displayed) ?Labs Reviewed - No data to display ? ?EKG ?EKG Interpretation ? ?Date/Time:  Wednesday July 05 2021 13:14:23 EDT ?Ventricular Rate:  135 ?PR Interval:  149 ?QRS Duration: 80 ?QT Interval:  295 ?QTC Calculation: 443 ?R Axis:   17 ?Text Interpretation: Sinus tachycardia Anterior infarct, old Confirmed by Dorie Rank [phone]) on 07/05/2021 1:33:12 PM ? ?Radiology ?No results found. ? ?Procedures ?Procedures  ? ? ?Medications Ordered in ED ?Medications  ?methylPREDNISolone sodium succinate (SOLU-MEDROL) 125 mg/2 mL injection 125 mg (has no administration in time range)  ?ipratropium-albuterol (DUONEB) 0.5-2.5 (3) MG/3ML nebulizer solution 3 mL (has no administration in time range)  ? ? ?ED Course/ Medical Decision Making/ A&P ?  ?                        ?Medical Decision Making ?Amount and/or Complexity of Data Reviewed ?Labs: ordered. ?Radiology: ordered. ? ?Risk ?Prescription drug management. ? ? ?Patient with notable wheezing on exam.  Presentation concerning for the possibility of COPD exacerbation, pneumonia, CHF.  We will proceed with laboratory testing and x-rays.  I have ordered albuterol Atrovent magnesium and Solu-Medrol.  Care turned over to Dr Vanita Panda ? ? ? ? ? ? ?Final Clinical Impression(s) / ED Diagnoses ?pending ? ? ?  ?Dorie Rank, MD ?07/05/21 1518 ? ?

## 2021-07-05 NOTE — ED Notes (Signed)
Dietary called at this time for dinner tray. ?

## 2021-07-06 DIAGNOSIS — J441 Chronic obstructive pulmonary disease with (acute) exacerbation: Secondary | ICD-10-CM | POA: Diagnosis not present

## 2021-07-06 DIAGNOSIS — J9611 Chronic respiratory failure with hypoxia: Secondary | ICD-10-CM | POA: Diagnosis not present

## 2021-07-06 DIAGNOSIS — J189 Pneumonia, unspecified organism: Secondary | ICD-10-CM | POA: Diagnosis not present

## 2021-07-06 LAB — BASIC METABOLIC PANEL
Anion gap: 8 (ref 5–15)
BUN: 20 mg/dL (ref 8–23)
CO2: 34 mmol/L — ABNORMAL HIGH (ref 22–32)
Calcium: 9.1 mg/dL (ref 8.9–10.3)
Chloride: 97 mmol/L — ABNORMAL LOW (ref 98–111)
Creatinine, Ser: 0.93 mg/dL (ref 0.44–1.00)
GFR, Estimated: >60 mL/min (ref >60)
Glucose, Bld: 177 mg/dL — ABNORMAL HIGH (ref 70–99)
Potassium: 4.4 mmol/L (ref 3.5–5.1)
Sodium: 139 mmol/L (ref 135–145)

## 2021-07-06 LAB — CBC
HCT: 34.6 % — ABNORMAL LOW (ref 36.0–46.0)
Hemoglobin: 10.3 g/dL — ABNORMAL LOW (ref 12.0–15.0)
MCH: 26.8 pg (ref 26.0–34.0)
MCHC: 29.8 g/dL — ABNORMAL LOW (ref 30.0–36.0)
MCV: 90.1 fL (ref 80.0–100.0)
Platelets: 175 10*3/uL (ref 150–400)
RBC: 3.84 MIL/uL — ABNORMAL LOW (ref 3.87–5.11)
RDW: 13.2 % (ref 11.5–15.5)
WBC: 2.9 10*3/uL — ABNORMAL LOW (ref 4.0–10.5)
nRBC: 0 % (ref 0.0–0.2)

## 2021-07-06 LAB — HIV ANTIBODY (ROUTINE TESTING W REFLEX): HIV Screen 4th Generation wRfx: NONREACTIVE

## 2021-07-06 LAB — STREP PNEUMONIAE URINARY ANTIGEN: Strep Pneumo Urinary Antigen: NEGATIVE

## 2021-07-06 MED ORDER — IPRATROPIUM-ALBUTEROL 0.5-2.5 (3) MG/3ML IN SOLN
3.0000 mL | Freq: Three times a day (TID) | RESPIRATORY_TRACT | Status: DC
  Administered 2021-07-07 – 2021-07-08 (×4): 3 mL via RESPIRATORY_TRACT
  Filled 2021-07-06 (×4): qty 3

## 2021-07-06 MED ORDER — SODIUM CHLORIDE 0.9 % IV SOLN: INTRAVENOUS | Status: DC | PRN

## 2021-07-06 NOTE — Progress Notes (Signed)
?PROGRESS NOTE ? ? ? ?Lauren Mclaughlin  EZM:[id number] DOB: March 31, 1951 DOA: 07/05/2021 ?PCP: Glenda Chroman, MD  ? ? ?Brief Narrative:  ?71 year old female with a history of oxygen dependent COPD, admitted to the hospital with shortness of breath, cough and wheezing.  She was admitted with COPD exacerbation and probable community-acquired pneumonia.  Started on antibiotics, steroids and bronchodilators. ? ? ?Assessment & Plan: ?  ?Principal Problem: ?  Pneumonia ?Active Problems: ?  COPD with acute exacerbation ([place]) ?  Chronic respiratory failure with hypoxia (HCC) ?  Hypothyroid ? ? ?Community-acquired pneumonia ?-Continue ceftriaxone and azithromycin ? ?COPD exacerbation ?-Started on Solu-Medrol ?-Continue bronchodilators ? ?Chronic respiratory failure with hypoxia ?-On 3 L of oxygen ? ?Hypothyroidism ?-Continue Synthroid ? ? ?DVT prophylaxis: enoxaparin (LOVENOX) injection 40 mg Start: 07/05/21 2200 ? ?Code Status: Full code ?Family Communication: Discussed with patient ?Disposition Plan: Status is: Observation ?The patient remains OBS appropriate and will d/c before 2 midnights. ? ? ? ? ?Consultants:  ? ? ?Procedures:  ? ? ?Antimicrobials:  ?Ceftriaxone 3/15 > ?Azithromycin 3/15 > ? ? ?Subjective: ?Reports continued cough and wheezing.  Becomes short of breath on minimal exertion and does not feel that she is back to baseline yet. ? ?Objective: ?Vitals:  ? 07/06/21 1314 07/06/21 1341 07/06/21 1947 07/06/21 2057  ?BP: (!) 142/70  (!) 147/76   ?Pulse: (!) 102  (!) 104   ?Resp: 18  16   ?Temp: 97.8 ?F (36.6 ?C)  97.8 ?F (36.6 ?C)   ?TempSrc: Oral  Oral   ?SpO2: 100% 97% 96% 98%  ?Weight:      ?Height:      ? ? ?Intake/Output Summary (Last 24 hours) at 07/06/2021 2355 ?Last data filed at 07/06/2021 2300 ?Gross per 24 hour  ?Intake 2128.8 ml  ?Output --  ?Net 2128.8 ml  ? ?Filed Weights  ? 07/05/21 2212  ?Weight: 67.7 kg  ? ? ?Examination: ? ?General exam: Appears calm and comfortable  ?Respiratory system: Clear to  auscultation. Respiratory effort normal. ?Cardiovascular system: S1 & S2 heard, RRR. No JVD, murmurs, rubs, gallops or clicks. No pedal edema. ?Gastrointestinal system: Abdomen is nondistended, soft and nontender. No organomegaly or masses felt. Normal bowel sounds heard. ?Central nervous system: Alert and oriented. No focal neurological deficits. ?Extremities: Symmetric 5 x 5 power. ?Skin: No rashes, lesions or ulcers ?Psychiatry: Judgement and insight appear normal. Mood & affect appropriate.  ? ? ? ?Data Reviewed: I have personally reviewed following labs and imaging studies ? ?CBC: ?Recent Labs  ?Lab 07/05/21 ?1532 07/06/21 ?0454  ?WBC 4.7 2.9*  ?HGB 10.7* 10.3*  ?HCT 35.7* 34.6*  ?MCV 89.5 90.1  ?PLT 191 175  ? ?Basic Metabolic Panel: ?Recent Labs  ?Lab 07/05/21 ?1532 07/06/21 ?0454  ?NA 137 139  ?K 3.9 4.4  ?CL 94* 97*  ?CO2 34* 34*  ?GLUCOSE 95 177*  ?BUN 16 20  ?CREATININE 0.86 0.93  ?CALCIUM 8.9 9.1  ? ?GFR: ?Estimated Creatinine Clearance: 50.7 mL/min (by C-G formula based on SCr of 0.93 mg/dL). ?Liver Function Tests: ?No results for input(s): AST, ALT, ALKPHOS, BILITOT, PROT, ALBUMIN in the last 168 hours. ?No results for input(s): LIPASE, AMYLASE in the last 168 hours. ?No results for input(s): AMMONIA in the last 168 hours. ?Coagulation Profile: ?No results for input(s): INR, PROTIME in the last 168 hours. ?Cardiac Enzymes: ?No results for input(s): CKTOTAL, CKMB, CKMBINDEX, TROPONINI in the last 168 hours. ?BNP (last 3 results) ?No results for input(s): PROBNP in the last 8760 hours. ?  HbA1C: ?No results for input(s): HGBA1C in the last 72 hours. ?CBG: ?No results for input(s): GLUCAP in the last 168 hours. ?Lipid Profile: ?No results for input(s): CHOL, HDL, LDLCALC, TRIG, CHOLHDL, LDLDIRECT in the last 72 hours. ?Thyroid Function Tests: ?No results for input(s): TSH, T4TOTAL, FREET4, T3FREE, THYROIDAB in the last 72 hours. ?Anemia Panel: ?No results for input(s): VITAMINB12, FOLATE, FERRITIN, TIBC,  IRON, RETICCTPCT in the last 72 hours. ?Sepsis Labs: ?No results for input(s): PROCALCITON, LATICACIDVEN in the last 168 hours. ? ?Recent Results (from the past 240 hour(s))  ?Resp Panel by RT-PCR (Flu A&B, Covid) Nasopharyngeal Swab     Status: None  ? Collection Time: 07/05/21  3:16 PM  ? Specimen: Nasopharyngeal Swab; Nasopharyngeal(NP) swabs in vial transport medium  ?Result Value Ref Range Status  ? SARS Coronavirus 2 by RT PCR NEGATIVE NEGATIVE Final  ?  Comment: (NOTE) ?SARS-CoV-2 target nucleic acids are NOT DETECTED. ? ?The SARS-CoV-2 RNA is generally detectable in upper respiratory ?specimens during the acute phase of infection. The lowest ?concentration of SARS-CoV-2 viral copies this assay can detect is ?138 copies/mL. A negative result does not preclude SARS-Cov-2 ?infection and should not be used as the sole basis for treatment or ?other patient management decisions. A negative result may occur with  ?improper specimen collection/handling, submission of specimen other ?than nasopharyngeal swab, presence of viral mutation(s) within the ?areas targeted by this assay, and inadequate number of viral ?copies(<138 copies/mL). A negative result must be combined with ?clinical observations, patient history, and epidemiological ?information. The expected result is Negative. ? ?Fact Sheet for Patients:  ?EntrepreneurPulse.com.au ? ?Fact Sheet for Healthcare Providers:  ?IncredibleEmployment.be ? ?This test is no t yet approved or cleared by the [place] FDA and  ?has been authorized for detection and/or diagnosis of SARS-CoV-2 by ?FDA under an Emergency Use Authorization (EUA). This EUA will remain  ?in effect (meaning this test can be used) for the duration of the ?COVID-19 declaration under Section 564(b)(1) of the Act, 21 ?U.S.C.section 360bbb-3(b)(1), unless the authorization is terminated  ?or revoked sooner.  ? ? ?  ? Influenza A by PCR NEGATIVE NEGATIVE Final  ?  Influenza B by PCR NEGATIVE NEGATIVE Final  ?  Comment: (NOTE) ?The Xpert Xpress SARS-CoV-2/FLU/RSV plus assay is intended as an aid ?in the diagnosis of influenza from Nasopharyngeal swab specimens and ?should not be used as a sole basis for treatment. Nasal washings and ?aspirates are unacceptable for Xpert Xpress SARS-CoV-2/FLU/RSV ?testing. ? ?Fact Sheet for Patients: ?EntrepreneurPulse.com.au ? ?Fact Sheet for Healthcare Providers: ?IncredibleEmployment.be ? ?This test is not yet approved or cleared by the [place] FDA and ?has been authorized for detection and/or diagnosis of SARS-CoV-2 by ?FDA under an Emergency Use Authorization (EUA). This EUA will remain ?in effect (meaning this test can be used) for the duration of the ?COVID-19 declaration under Section 564(b)(1) of the Act, 21 U.S.C. ?section 360bbb-3(b)(1), unless the authorization is terminated or ?revoked. ? ?Performed at [facility], [address], [place], [place] [address] ?  ?Expectorated Sputum Assessment w Gram Stain, Rflx to Resp Cult     Status: None  ? Collection Time: 07/05/21 11:00 PM  ? Specimen: Expectorated Sputum  ?Result Value Ref Range Status  ? Specimen Description EXPECTORATED SPUTUM  Final  ? Special Requests NONE  Final  ? Sputum evaluation   Final  ?  Sputum specimen not acceptable for testing.  Please recollect.   ?Performed at [facility], [address], [place], [place]  [address] ?  ? Report Status 07/05/2021 FINAL  Final  ?  ? ? ? ? ? ?Radiology Studies: ?DG Chest Port 1 View ? ?Result Date: 07/05/2021 ?CLINICAL DATA:  Shortness of breath. EXAM: PORTABLE CHEST 1 VIEW COMPARISON:  07/06/2019 FINDINGS: Cardiac silhouette and mediastinal contours are within normal limits with moderate calcification within the aortic arch. Moderate lucencies within the upper lungs suggesting chronic emphysematous changes, similar to prior. There is flattening of the diaphragms and moderate to high-grade  hyperinflation unchanged. New subtle heterogeneous airspace opacification within the superior left lung. The right lung is clear. No pleural effusion is seen. No pneumothorax. No acute skeletal abnormality. Lik

## 2021-07-06 NOTE — Progress Notes (Signed)
?  Transition of Care (TOC) Screening Note ? ? ?Patient Details  ?Name: Lauren Mclaughlin ?Date of Birth: 07/18/1950 ? ? ?Transition of Care (TOC) CM/SW Contact:    ?Iona Beard, LCSWA ?Phone Number: ?07/06/2021, 10:58 AM ? ? ? ?Transition of Care Department [facility]) has reviewed patient and no TOC needs have been identified at this time. We will continue to monitor patient advancement through interdisciplinary progression rounds. If new patient transition needs arise, please place a TOC consult. ?  ?

## 2021-07-07 DIAGNOSIS — Z79899 Other long term (current) drug therapy: Secondary | ICD-10-CM | POA: Diagnosis not present

## 2021-07-07 DIAGNOSIS — J440 Chronic obstructive pulmonary disease with acute lower respiratory infection: Secondary | ICD-10-CM | POA: Diagnosis present

## 2021-07-07 DIAGNOSIS — Z90711 Acquired absence of uterus with remaining cervical stump: Secondary | ICD-10-CM | POA: Diagnosis not present

## 2021-07-07 DIAGNOSIS — Z87891 Personal history of nicotine dependence: Secondary | ICD-10-CM | POA: Diagnosis not present

## 2021-07-07 DIAGNOSIS — J441 Chronic obstructive pulmonary disease with (acute) exacerbation: Secondary | ICD-10-CM | POA: Diagnosis present

## 2021-07-07 DIAGNOSIS — J189 Pneumonia, unspecified organism: Secondary | ICD-10-CM | POA: Diagnosis present

## 2021-07-07 DIAGNOSIS — Z79890 Hormone replacement therapy (postmenopausal): Secondary | ICD-10-CM | POA: Diagnosis not present

## 2021-07-07 DIAGNOSIS — E039 Hypothyroidism, unspecified: Secondary | ICD-10-CM | POA: Diagnosis not present

## 2021-07-07 DIAGNOSIS — J9611 Chronic respiratory failure with hypoxia: Secondary | ICD-10-CM | POA: Diagnosis not present

## 2021-07-07 DIAGNOSIS — Z7951 Long term (current) use of inhaled steroids: Secondary | ICD-10-CM | POA: Diagnosis not present

## 2021-07-07 DIAGNOSIS — Z9981 Dependence on supplemental oxygen: Secondary | ICD-10-CM | POA: Diagnosis not present

## 2021-07-07 DIAGNOSIS — Z9049 Acquired absence of other specified parts of digestive tract: Secondary | ICD-10-CM | POA: Diagnosis not present

## 2021-07-07 DIAGNOSIS — Z20822 Contact with and (suspected) exposure to covid-19: Secondary | ICD-10-CM | POA: Diagnosis present

## 2021-07-07 DIAGNOSIS — Z888 Allergy status to other drugs, medicaments and biological substances status: Secondary | ICD-10-CM | POA: Diagnosis not present

## 2021-07-07 DIAGNOSIS — I272 Pulmonary hypertension, unspecified: Secondary | ICD-10-CM | POA: Diagnosis present

## 2021-07-07 DIAGNOSIS — Z881 Allergy status to other antibiotic agents status: Secondary | ICD-10-CM | POA: Diagnosis not present

## 2021-07-07 LAB — LEGIONELLA PNEUMOPHILA SEROGP 1 UR AG: L. pneumophila Serogp 1 Ur Ag: NEGATIVE

## 2021-07-07 MED ORDER — SALINE SPRAY 0.65 % NA SOLN
1.0000 | NASAL | Status: DC | PRN
  Administered 2021-07-07: 1 via NASAL
  Filled 2021-07-07: qty 44

## 2021-07-07 MED ORDER — METHYLPREDNISOLONE SODIUM SUCC 125 MG IJ SOLR
60.0000 mg | Freq: Two times a day (BID) | INTRAMUSCULAR | Status: DC
  Administered 2021-07-07 – 2021-07-08 (×2): 60 mg via INTRAVENOUS
  Filled 2021-07-07 (×2): qty 2

## 2021-07-07 NOTE — Plan of Care (Signed)
?  Problem: Acute Rehab PT Goals(only PT should resolve) ?Goal: Pt Will Transfer Bed To Chair/Chair To Bed ?Outcome: Progressing ?Flowsheets (Taken 07/07/2021 1356) ?Pt will Transfer Bed to Chair/Chair to Bed: ? with supervision ? min guard assist ?Goal: Pt Will Perform Standing Balance Or Pre-Gait ?Outcome: Progressing ?Flowsheets (Taken 07/07/2021 1356) ?Pt will perform standing balance or pre-gait: with Modified Independent ?Goal: Pt Will Ambulate ?Outcome: Progressing ?Flowsheets (Taken 07/07/2021 1356) ?Pt will Ambulate: ? 25 feet ? with least restrictive assistive device ? with min guard assist ?Goal: Pt/caregiver will Perform Home Exercise Program ?Outcome: Progressing ?Flowsheets (Taken 07/07/2021 1356) ?Pt/caregiver will Perform Home Exercise Program: ? For increased strengthening ? For improved balance ? Independently ? 1:58 PM, 07/07/21 ?Mearl Latin PT, DPT ?Physical Therapist at [facility] ?[facility] ? ?

## 2021-07-07 NOTE — Evaluation (Signed)
Physical Therapy Evaluation ?Patient Details ?Name: Lauren Mclaughlin ?MRN: [id number] ?DOB: 1950-09-09 ?Today's Date: 07/07/2021 ? ?History of Present Illness ? Lauren Mclaughlin  is a 71 y.o. female, with past medical history of COPD, chronic hypoxic respiratory failure on 3 L nasal cannula at baseline, and hypothyroidism, she presents to ED secondary to complaints of shortness of breath, patient reports dyspnea ongoing for several days, increasing in severity, she is more dyspneic today with, and she developed hoarseness and lost her voice which prompted her to come to ED, she denies any fever, chest pain, chills, hemoptysis, she does report cough, nonproductive. ?  ?Clinical Impression ? Patient limited for functional mobility as stated below secondary to BLE weakness, fatigue and poor standing balance. Patient functioning near baseline and is typically limited with mobility and requires assist for ADL. Patient transitions to EOB with slow, labored movements and demonstrates good sitting tolerance and sitting balance. She requires min assist to transfer to standing without AD and is unsteady upon standing. She is able to ambulate a few feet with unsteady cadence without AD without loss of balance. Gait improves with HHA but continues to be unsteady. Patient educated on use of RW upon return home to reduce risk of falls. Patient will benefit from continued physical therapy in hospital and recommended venue below to increase strength, balance, endurance for safe ADLs and gait. ?   ?   ? ?Recommendations for follow up therapy are one component of a multi-disciplinary discharge planning process, led by the attending physician.  Recommendations may be updated based on patient status, additional functional criteria and insurance authorization. ? ?Follow Up Recommendations Home health PT ? ?  ?Assistance Recommended at Discharge Frequent or constant Supervision/Assistance  ?Patient can return home with the following ? A  lot of help with walking and/or transfers;A lot of help with bathing/dressing/bathroom;Assistance with cooking/housework;Help with stairs or ramp for entrance ? ?  ?Equipment Recommendations None recommended by PT  ?Recommendations for Other Services ?    ?  ?Functional Status Assessment Patient has had a recent decline in their functional status and demonstrates the ability to make significant improvements in function in a reasonable and predictable amount of time.  ? ?  ?Precautions / Restrictions Precautions ?Precautions: Fall ?Restrictions ?Weight Bearing Restrictions: No  ? ?  ? ?Mobility ? Bed Mobility ?Overal bed mobility: Modified Independent ?  ?  ?  ?  ?  ?  ?General bed mobility comments: slow, labored ?  ? ?Transfers ?Overall transfer level: Needs assistance ?Equipment used: None ?Transfers: Sit to/from Stand, Bed to chair/wheelchair/BSC ?Sit to Stand: Min guard, Min assist ?  ?Step pivot transfers: Min guard, Min assist ?  ?  ?  ?General transfer comment: slow, labored transfer to standing without AD, unsteady upon standing ?  ? ?Ambulation/Gait ?Ambulation/Gait assistance: Min guard, Min assist ?Gait Distance (Feet): 10 Feet ?Assistive device: 1 person hand held assist, None ?  ?  ?  ?  ?General Gait Details: able to ambulate a few unsteady steps without AD, gait improves with HHA some unsteadiness still but limited by fatigue ? ?Stairs ?  ?  ?  ?  ?  ? ?Wheelchair Mobility ?  ? ?Modified Rankin (Stroke Patients Only) ?  ? ?  ? ?Balance Overall balance assessment: Needs assistance ?Sitting-balance support: No upper extremity supported, Feet supported ?Sitting balance-Leahy Scale: Good ?Sitting balance - Comments: seated EOB ?  ?Standing balance support: No upper extremity supported ?Standing balance-Leahy Scale: Poor ?Standing balance comment: without  AD ?  ?  ?  ?  ?  ?  ?  ?  ?  ?  ?  ?   ? ? ? ?Pertinent Vitals/Pain Pain Assessment ?Pain Assessment: No/denies pain  ? ? ?Home Living Family/patient  expects to be discharged to:: Private residence ?Living Arrangements: Alone ?Available Help at Discharge: Family;Friend(s);Personal care attendant ?Type of Home: Apartment ?Home Access: Stairs to enter ?Entrance Stairs-Rails: None ?Entrance Stairs-Number of Steps: 1 ?  ?Home Layout: One level ?Home Equipment: Rolling Walker (2 wheels);BSC/3in1;Shower seat;Grab bars - toilet ?   ?  ?Prior Function Prior Level of Function : Needs assist ?  ?  ?  ?  ?  ?  ?Mobility Comments: short household distances using walls for support or RW ?ADLs Comments: assisted by her aids and her brother ?  ? ? ?Hand Dominance  ?   ? ?  ?Extremity/Trunk Assessment  ? Upper Extremity Assessment ?Upper Extremity Assessment: Generalized weakness ?  ? ?Lower Extremity Assessment ?Lower Extremity Assessment: Generalized weakness ?  ? ?Cervical / Trunk Assessment ?Cervical / Trunk Assessment: Normal  ?Communication  ? Communication: No difficulties  ?Cognition Arousal/Alertness: Awake/alert ?Behavior During Therapy: [facility] for tasks assessed/performed ?Overall Cognitive Status: Within Functional Limits for tasks assessed ?  ?  ?  ?  ?  ?  ?  ?  ?  ?  ?  ?  ?  ?  ?  ?  ?  ?  ?  ? ?  ?General Comments   ? ?  ?Exercises    ? ?Assessment/Plan  ?  ?PT Assessment Patient needs continued PT services  ?PT Problem List Decreased strength;Decreased mobility;Cardiopulmonary status limiting activity;Decreased activity tolerance;Decreased balance ? ?   ?  ?PT Treatment Interventions DME instruction;Therapeutic exercise;Gait training;Balance training;Stair training;Neuromuscular re-education;Functional mobility training;Therapeutic activities;Patient/family education   ? ?PT Goals (Current goals can be found in the Care Plan section)  ?Acute Rehab PT Goals ?Patient Stated Goal: Return home ?PT Goal Formulation: With patient ?Time For Goal Achievement: 07/21/21 ?Potential to Achieve Goals: Good ? ?  ?Frequency Min 3X/week ?  ? ? ?Co-evaluation   ?  ?  ?  ?  ? ? ?   ?AM-PAC PT "6 Clicks" Mobility  ?Outcome Measure Help needed turning from your back to your side while in a flat bed without using bedrails?: None ?Help needed moving from lying on your back to sitting on the side of a flat bed without using bedrails?: A Little ?Help needed moving to and from a bed to a chair (including a wheelchair)?: A Little ?Help needed standing up from a chair using your arms (e.g., wheelchair or bedside chair)?: A Little ?Help needed to walk in hospital room?: A Lot ?Help needed climbing 3-5 steps with a railing? : Total ?6 Click Score: 16 ? ?  ?End of Session   ?Activity Tolerance: Patient limited by fatigue ?Patient left: in bed;with call bell/phone within reach ?Nurse Communication: Mobility status ?PT Visit Diagnosis: Unsteadiness on feet (R26.81);Other abnormalities of gait and mobility (R26.89);Muscle weakness (generalized) (M62.81) ?  ? ?Time: 5465-6812 ?PT Time Calculation (min) (ACUTE ONLY): 34 min ? ? ?Charges:   PT Evaluation ?$PT Eval Low Complexity: 1 Low ?PT Treatments ?$Therapeutic Activity: 23-37 mins ?  ?   ? ? ?1:55 PM, 07/07/21 ?Mearl Latin PT, DPT ?Physical Therapist at [facility] ?[facility] ? ? ?

## 2021-07-07 NOTE — Progress Notes (Signed)
Has been up to bedside commode several times today for voiding.  SOB with any exertion.   ?

## 2021-07-07 NOTE — TOC Transition Note (Signed)
Transition of Care (TOC) - CM/SW Discharge Note ? ? ?Patient Details  ?Name: Lauren Mclaughlin ?MRN: [id number] ?Date of Birth: [birth year] ? ?Transition of Care (TOC) CM/SW Contact:  ?Shade Flood, LCSW ?Phone Number: ?07/07/2021, 4:09 PM ? ? ?Clinical Narrative:    ? ?PT recommending HH PT at dc. MD indicating likely dc home tomorrow. Pt agreeable to [facility] Referred out and information added to AVS.  ? ?Weekend TOC will be available if additional dc needs arise. ? ?Final next level of care: [place] ?Barriers to Discharge: Barriers Resolved ? ? ?Patient Goals and CMS Choice ?Patient states their goals for this hospitalization and ongoing recovery are:: go home ?CMS Medicare.gov Compare Post Acute Care list provided to:: Patient ?Choice offered to / list presented to : Patient ? ?Discharge Placement ?  ?           ?  ?  ?  ?  ? ?Discharge Plan and Services ?  ?  ?           ?  ?  ?  ?  ?  ?HH Arranged: PT ?[place] Agency: [place] ?Date HH Agency Contacted: 07/07/21 ?  ?Representative spoke with at [place]: Tommi Rumps ? ?Social Determinants of Health (SDOH) Interventions ?  ? ? ?Readmission Risk Interventions ?No flowsheet data found. ? ? ? ? ?

## 2021-07-07 NOTE — Care Management Important Message (Signed)
Important Message ? ?Patient Details  ?Name: Lauren Mclaughlin ?MRN: [id number] ?Date of Birth: [birth year] ? ? ?Medicare Important Message Given:  Yes ? ? ? ? ?Tommy Medal ?07/07/2021, 3:29 PM ?

## 2021-07-07 NOTE — Progress Notes (Signed)
?PROGRESS NOTE ? ? ? ?Lauren Mclaughlin  ZOX:[id number] DOB: [birth year] DOA: 07/05/2021 ?PCP: Glenda Chroman, MD  ? ? ?Brief Narrative:  ?71 year old female with a history of oxygen dependent COPD, admitted to the hospital with shortness of breath, cough and wheezing.  She was admitted with COPD exacerbation and probable community-acquired pneumonia.  Started on antibiotics, steroids and bronchodilators. ? ? ?Assessment & Plan: ?  ?Principal Problem: ?  Pneumonia ?Active Problems: ?  COPD with acute exacerbation ([place]) ?  Chronic respiratory failure with hypoxia ([facility]) ?  Hypothyroid ?  COPD exacerbation ([place]) ? ? ?Community-acquired pneumonia ?-Continue ceftriaxone and azithromycin ? ?COPD exacerbation ?-Started on Solu-Medrol ?-Continue bronchodilators ?-Continue IV steroids since she continues to wheeze ? ?Chronic respiratory failure with hypoxia ?-On 3 L of oxygen ? ?Hypothyroidism ?-Continue Synthroid ? ? ?DVT prophylaxis: enoxaparin (LOVENOX) injection 40 mg Start: 07/05/21 2200 ? ?Code Status: Full code ?Family Communication: Discussed with patient ?Disposition Plan: Status is: Inpatient ?The patient will require care spanning > 2 midnights and should be moved to inpatient because: Continued shortness of breath and wheezing needing IV steroids ? ? ? ? ?Consultants:  ? ? ?Procedures:  ? ? ?Antimicrobials:  ?Ceftriaxone 3/15 > ?Azithromycin 3/15 > ? ? ?Subjective: ?Still feels short of breath and wheezing ? ?Objective: ?Vitals:  ? 07/07/21 0803 07/07/21 0808 07/07/21 1326 07/07/21 1438  ?BP:   (!) 136/93   ?Pulse:   93   ?Resp:   18   ?Temp:   97.6 ?F (36.4 ?C)   ?TempSrc:   Oral   ?SpO2: 96% 100% 100% 100%  ?Weight:      ?Height:      ? ? ?Intake/Output Summary (Last 24 hours) at 07/07/2021 1925 ?Last data filed at 07/07/2021 1328 ?Gross per 24 hour  ?Intake 1281.66 ml  ?Output --  ?Net 1281.66 ml  ? ?Filed Weights  ? 07/05/21 2212  ?Weight: 67.7 kg  ? ? ?Examination: ? ?General exam: Appears calm and comfortable   ?Respiratory system: Mild bilateral wheeze with fair air movement bilaterally ?Cardiovascular system: S1 & S2 heard, RRR. No JVD, murmurs, rubs, gallops or clicks. No pedal edema. ?Gastrointestinal system: Abdomen is nondistended, soft and nontender. No organomegaly or masses felt. Normal bowel sounds heard. ?Central nervous system: Alert and oriented. No focal neurological deficits. ?Extremities: Symmetric 5 x 5 power. ?Skin: No rashes, lesions or ulcers ?Psychiatry: Judgement and insight appear normal. Mood & affect appropriate.  ? ? ? ?Data Reviewed: I have personally reviewed following labs and imaging studies ? ?CBC: ?Recent Labs  ?Lab 07/05/21 ?1532 07/06/21 ?0454  ?WBC 4.7 2.9*  ?HGB 10.7* 10.3*  ?HCT 35.7* 34.6*  ?MCV 89.5 90.1  ?PLT 191 175  ? ?Basic Metabolic Panel: ?Recent Labs  ?Lab 07/05/21 ?1532 07/06/21 ?0454  ?NA 137 139  ?K 3.9 4.4  ?CL 94* 97*  ?CO2 34* 34*  ?GLUCOSE 95 177*  ?BUN 16 20  ?CREATININE 0.86 0.93  ?CALCIUM 8.9 9.1  ? ?GFR: ?Estimated Creatinine Clearance: 50.7 mL/min (by C-G formula based on SCr of 0.93 mg/dL). ?Liver Function Tests: ?No results for input(s): AST, ALT, ALKPHOS, BILITOT, PROT, ALBUMIN in the last 168 hours. ?No results for input(s): LIPASE, AMYLASE in the last 168 hours. ?No results for input(s): AMMONIA in the last 168 hours. ?Coagulation Profile: ?No results for input(s): INR, PROTIME in the last 168 hours. ?Cardiac Enzymes: ?No results for input(s): CKTOTAL, CKMB, CKMBINDEX, TROPONINI in the last 168 hours. ?BNP (last 3 results) ?No results for input(s):  PROBNP in the last 8760 hours. ?HbA1C: ?No results for input(s): HGBA1C in the last 72 hours. ?CBG: ?No results for input(s): GLUCAP in the last 168 hours. ?Lipid Profile: ?No results for input(s): CHOL, HDL, LDLCALC, TRIG, CHOLHDL, LDLDIRECT in the last 72 hours. ?Thyroid Function Tests: ?No results for input(s): TSH, T4TOTAL, FREET4, T3FREE, THYROIDAB in the last 72 hours. ?Anemia Panel: ?No results for input(s):  VITAMINB12, FOLATE, FERRITIN, TIBC, IRON, RETICCTPCT in the last 72 hours. ?Sepsis Labs: ?No results for input(s): PROCALCITON, LATICACIDVEN in the last 168 hours. ? ?Recent Results (from the past 240 hour(s))  ?Resp Panel by RT-PCR (Flu A&B, Covid) Nasopharyngeal Swab     Status: None  ? Collection Time: 07/05/21  3:16 PM  ? Specimen: Nasopharyngeal Swab; Nasopharyngeal(NP) swabs in vial transport medium  ?Result Value Ref Range Status  ? SARS Coronavirus 2 by RT PCR NEGATIVE NEGATIVE Final  ?  Comment: (NOTE) ?SARS-CoV-2 target nucleic acids are NOT DETECTED. ? ?The SARS-CoV-2 RNA is generally detectable in upper respiratory ?specimens during the acute phase of infection. The lowest ?concentration of SARS-CoV-2 viral copies this assay can detect is ?138 copies/mL. A negative result does not preclude SARS-Cov-2 ?infection and should not be used as the sole basis for treatment or ?other patient management decisions. A negative result may occur with  ?improper specimen collection/handling, submission of specimen other ?than nasopharyngeal swab, presence of viral mutation(s) within the ?areas targeted by this assay, and inadequate number of viral ?copies(<138 copies/mL). A negative result must be combined with ?clinical observations, patient history, and epidemiological ?information. The expected result is Negative. ? ?Fact Sheet for Patients:  ?EntrepreneurPulse.com.au ? ?Fact Sheet for Healthcare Providers:  ?IncredibleEmployment.be ? ?This test is no t yet approved or cleared by the [place] FDA and  ?has been authorized for detection and/or diagnosis of SARS-CoV-2 by ?FDA under an Emergency Use Authorization (EUA). This EUA will remain  ?in effect (meaning this test can be used) for the duration of the ?COVID-19 declaration under Section 564(b)(1) of the Act, 21 ?U.S.C.section 360bbb-3(b)(1), unless the authorization is terminated  ?or revoked sooner.  ? ? ?  ? Influenza A  by PCR NEGATIVE NEGATIVE Final  ? Influenza B by PCR NEGATIVE NEGATIVE Final  ?  Comment: (NOTE) ?The Xpert Xpress SARS-CoV-2/FLU/RSV plus assay is intended as an aid ?in the diagnosis of influenza from Nasopharyngeal swab specimens and ?should not be used as a sole basis for treatment. Nasal washings and ?aspirates are unacceptable for Xpert Xpress SARS-CoV-2/FLU/RSV ?testing. ? ?Fact Sheet for Patients: ?EntrepreneurPulse.com.au ? ?Fact Sheet for Healthcare Providers: ?IncredibleEmployment.be ? ?This test is not yet approved or cleared by the [place] FDA and ?has been authorized for detection and/or diagnosis of SARS-CoV-2 by ?FDA under an Emergency Use Authorization (EUA). This EUA will remain ?in effect (meaning this test can be used) for the duration of the ?COVID-19 declaration under Section 564(b)(1) of the Act, 21 U.S.C. ?section 360bbb-3(b)(1), unless the authorization is terminated or ?revoked. ? ?Performed at [facility], [address], [place], [place] [address] ?  ?Expectorated Sputum Assessment w Gram Stain, Rflx to Resp Cult     Status: None  ? Collection Time: 07/05/21 11:00 PM  ? Specimen: Expectorated Sputum  ?Result Value Ref Range Status  ? Specimen Description EXPECTORATED SPUTUM  Final  ? Special Requests NONE  Final  ? Sputum evaluation   Final  ?  Sputum specimen not acceptable for testing.  Please recollect.   ?Performed at [facility]  [facility], [address], [place], [place] [address] ?  ? Report Status 07/05/2021 FINAL  Final  ?  ? ? ? ? ? ?Radiology Studies: ?No results found. ? ? ? ? ? ?Scheduled Meds: ? B-complex with vitamin C  1 tablet Oral Daily  ? budesonide  0.5 mg Nebulization BID  ? cholecalciferol  1,000 Units Oral Daily  ? enoxaparin (LOVENOX) injection  40 mg Subcutaneous Q24H  ? ipratropium-albuterol  3 mL Nebulization TID  ? levothyroxine  50 mcg Oral Daily  ? methylPREDNISolone (SOLU-MEDROL) injection  60 mg Intravenous Q12H  ? ?Continuous  Infusions: ? sodium chloride 10 mL/hr at 07/07/21 1654  ? azithromycin 500 mg (07/07/21 1658)  ? cefTRIAXone (ROCEPHIN)  IV 2 g (07/07/21 1831)  ? ? ? LOS: 0 days  ? ? ?Time spent: 30mns ? ? ? ?[place]

## 2021-07-08 MED ORDER — AZITHROMYCIN 500 MG PO TABS: 500.0000 mg | ORAL_TABLET | Freq: Every day | ORAL | 0 refills | Status: AC

## 2021-07-08 MED ORDER — DM-GUAIFENESIN ER 30-600 MG PO TB12: 1.0000 | ORAL_TABLET | Freq: Two times a day (BID) | ORAL | 0 refills | Status: AC

## 2021-07-08 MED ORDER — CEFDINIR 300 MG PO CAPS: 600.0000 mg | ORAL_CAPSULE | Freq: Two times a day (BID) | ORAL | 0 refills | Status: AC

## 2021-07-08 MED ORDER — METHYLPREDNISOLONE 4 MG PO TBPK: ORAL_TABLET | ORAL | 0 refills | Status: AC

## 2021-07-08 NOTE — Discharge Summary (Signed)
Physician Discharge Summary  ?Ryn Peine QIO:[id number] DOB: [birth year] DOA: 07/05/2021 ? ?PCP: Glenda Chroman, MD ? ?Admit date: 07/05/2021 ?Discharge date: 07/08/2021 ? ?Admitted From: home ?Disposition:  home ? ?Recommendations for Outpatient Follow-up:  ?Follow up with PCP in 1-2 weeks ?Please obtain BMP/CBC in one week ?Repeat chest xray in 4 weeks ? ?Home Health:home health PT ?Equipment/Devices: ? ?Discharge Condition:stable ?CODE STATUS:full code ?Diet recommendation: heart healthy ? ?Brief/Interim Summary: ?71 year old female with a history of oxygen dependent COPD, admitted to the hospital with shortness of breath, cough and wheezing.  She was admitted with COPD exacerbation and probable community-acquired pneumonia.  Started on antibiotics, steroids and bronchodilators. ? ?Discharge Diagnoses:  ?Principal Problem: ?  Pneumonia ?Active Problems: ?  COPD with acute exacerbation ([place]) ?  Chronic respiratory failure with hypoxia ([facility]) ?  Hypothyroid ?  COPD exacerbation ([place]) ? ?Community-acquired pneumonia ?-treated with ceftriaxone and azithromycin ?-transitioned to oral antibiotics on discharge ?-will need repeat chest xray in 4 weeks ?  ?COPD exacerbation ?-Started on Solu-Medrol ?-Continue bronchodilators ?-transition to medrol dose pak ?  ?Chronic respiratory failure with hypoxia ?-On 3 L of oxygen ?  ?Hypothyroidism ?-Continue Synthroid ? ?Discharge Instructions ? ?Discharge Instructions   ? ? Diet - low sodium heart healthy   Complete by: As directed ?  ? Increase activity slowly   Complete by: As directed ?  ? ?  ? ?Allergies as of 07/08/2021   ? ?   Reactions  ? Amoxicillin   ? REACTION: rash  ? Cetirizine Hcl   ? REACTION: unspecified  ? Ciprofloxacin   ? REACTION: Joint Pain  ? Fexofenadine   ? REACTION: unspecified  ? Loratadine   ? REACTION: unspecified  ? Other   ? Pt states she has some other allergies but can not remember what they are at this time  ? Oxycodone-acetaminophen   ? REACTION:  Itching, "goes nuts"  ? Povidone-iodine   ? REACTION: rash  ? Propoxyphene N-acetaminophen   ? REACTION: rash  ? Sulfamethoxazole   ? REACTION: unspecified  ? ?  ? ?  ?Medication List  ?  ? ?STOP taking these medications   ? ?albuterol (2.5 MG/3ML) 0.083% nebulizer solution ?Commonly known as: PROVENTIL ?  ? ?  ? ?TAKE these medications   ? ?ALPRAZolam 0.5 MG tablet ?Commonly known as: Duanne Moron ?Take 0.5 mg by mouth 3 (three) times daily as needed. For anxiety ?  ?azithromycin 500 MG tablet ?Commonly known as: Zithromax ?Take 1 tablet (500 mg total) by mouth daily for 3 days. Take 1 tablet daily for 3 days. ?  ?B Complex-B12 Tabs ?Take 1 tablet by mouth daily. ?  ?budesonide 0.5 MG/2ML nebulizer solution ?Commonly known as: PULMICORT ?USE 2 ML (0.5 MG) VIA NEBULIZER TWICE A DAY ?  ?cefdinir 300 MG capsule ?Commonly known as: OMNICEF ?Take 2 capsules (600 mg total) by mouth 2 (two) times daily. ?  ?dextromethorphan-guaiFENesin 30-600 MG 12hr tablet ?Commonly known as: [place] DM ?Take 1 tablet by mouth 2 (two) times daily. ?  ?ipratropium-albuterol 0.5-2.5 (3) MG/3ML Soln ?Commonly known as: DuoNeb ?Take 3 mLs by nebulization 4 (four) times daily as needed. ?  ?levothyroxine 50 MCG tablet ?Commonly known as: SYNTHROID ?Take 50 mcg by mouth daily. ?  ?methylPREDNISolone 4 MG Tbpk tablet ?Commonly known as: MEDROL DOSEPAK ?Take as directed ?  ?MULTI-VITAMIN PO ?Take 1 tablet by mouth daily. ?  ?Vitamin D3 25 MCG (1000 UT) Caps ?Take 1 capsule by mouth daily. ?  ? ?  ? ?  Follow-up Information   ? ? Care, [facility] Follow up.   ?Specialty: Home Health Services ?Why: [facility] staff will call you to schedule in home Physical Therapy visits. ?Contact information: ?[address] ?[address] ?[place] [address] ?[phone] ? ? ?  ?  ? ?  ?  ? ?  ? ?Allergies  ?Allergen Reactions  ? Amoxicillin   ?  REACTION: rash  ? Cetirizine Hcl   ?  REACTION: unspecified  ? Ciprofloxacin   ?  REACTION: Joint Pain  ?  Fexofenadine   ?  REACTION: unspecified  ? Loratadine   ?  REACTION: unspecified  ? Other   ?  Pt states she has some other allergies but can not remember what they are at this time  ? Oxycodone-Acetaminophen   ?  REACTION: Itching, "goes nuts"  ? Povidone-Iodine   ?  REACTION: rash  ? Propoxyphene N-Acetaminophen   ?  REACTION: rash  ? Sulfamethoxazole   ?  REACTION: unspecified  ? ? ?Consultations: ? ? ? ?Procedures/Studies: ?DG Chest Port 1 View ? ?Result Date: 07/05/2021 ?CLINICAL DATA:  Shortness of breath. EXAM: PORTABLE CHEST 1 VIEW COMPARISON:  07/06/2019 FINDINGS: Cardiac silhouette and mediastinal contours are within normal limits with moderate calcification within the aortic arch. Moderate lucencies within the upper lungs suggesting chronic emphysematous changes, similar to prior. There is flattening of the diaphragms and moderate to high-grade hyperinflation unchanged. New subtle heterogeneous airspace opacification within the superior left lung. The right lung is clear. No pleural effusion is seen. No pneumothorax. No acute skeletal abnormality. Likely cholecystectomy clips. IMPRESSION: New mild heterogeneous opacity within the superolateral left lung suspicious for pneumonia. Recommend follow-up radiographs 6 weeks after treatment to ensure resolution. Chronic hyperinflation and emphysema. Electronically Signed   By: Yvonne Kendall M.D.   On: 07/05/2021 15:45   ? ? ? ?Subjective: ?Feels better, feels as though she is ready to go home ? ?Discharge Exam: ?Vitals:  ? 07/07/21 1947 07/07/21 2108 07/08/21 0263 07/08/21 0743  ?BP:  (!) 147/68    ?Pulse:  100    ?Resp:  18    ?Temp:  97.9 ?F (36.6 ?C)    ?TempSrc:  Oral    ?SpO2: 96% 97% 95% 95%  ?Weight:      ?Height:      ? ? ?General: Pt is alert, awake, not in acute distress ?Cardiovascular: RRR, S1/S2 +, no rubs, no gallops ?Respiratory: diminished breath sounds with no wheeze ?Abdominal: Soft, NT, ND, bowel sounds + ?Extremities: no edema, no  cyanosis ? ? ? ?The results of significant diagnostics from this hospitalization (including imaging, microbiology, ancillary and laboratory) are listed below for reference.   ? ? ?Microbiology: ?Recent Results (from the past 240 hour(s))  ?Resp Panel by RT-PCR (Flu A&B, Covid) Nasopharyngeal Swab     Status: None  ? Collection Time: 07/05/21  3:16 PM  ? Specimen: Nasopharyngeal Swab; Nasopharyngeal(NP) swabs in vial transport medium  ?Result Value Ref Range Status  ? SARS Coronavirus 2 by RT PCR NEGATIVE NEGATIVE Final  ?  Comment: (NOTE) ?SARS-CoV-2 target nucleic acids are NOT DETECTED. ? ?The SARS-CoV-2 RNA is generally detectable in upper respiratory ?specimens during the acute phase of infection. The lowest ?concentration of SARS-CoV-2 viral copies this assay can detect is ?138 copies/mL. A negative result does not preclude SARS-Cov-2 ?infection and should not be used as the sole basis for treatment or ?other patient management decisions. A negative result may occur with  ?  improper specimen collection/handling, submission of specimen other ?than nasopharyngeal swab, presence of viral mutation(s) within the ?areas targeted by this assay, and inadequate number of viral ?copies(<138 copies/mL). A negative result must be combined with ?clinical observations, patient history, and epidemiological ?information. The expected result is Negative. ? ?Fact Sheet for Patients:  ?EntrepreneurPulse.com.au ? ?Fact Sheet for Healthcare Providers:  ?IncredibleEmployment.be ? ?This test is no t yet approved or cleared by the [place] FDA and  ?has been authorized for detection and/or diagnosis of SARS-CoV-2 by ?FDA under an Emergency Use Authorization (EUA). This EUA will remain  ?in effect (meaning this test can be used) for the duration of the ?COVID-19 declaration under Section 564(b)(1) of the Act, 21 ?U.S.C.section 360bbb-3(b)(1), unless the authorization is terminated  ?or revoked  sooner.  ? ? ?  ? Influenza A by PCR NEGATIVE NEGATIVE Final  ? Influenza B by PCR NEGATIVE NEGATIVE Final  ?  Comment: (NOTE) ?The Xpert Xpress SARS-CoV-2/FLU/RSV plus assay is intended as an aid ?in the diagnosis of inf

## 2021-07-12 DIAGNOSIS — J439 Emphysema, unspecified: Secondary | ICD-10-CM | POA: Diagnosis not present

## 2021-07-12 DIAGNOSIS — J9611 Chronic respiratory failure with hypoxia: Secondary | ICD-10-CM | POA: Diagnosis not present

## 2021-07-12 DIAGNOSIS — I700 Atherosclerosis of aorta: Secondary | ICD-10-CM | POA: Diagnosis not present

## 2021-07-12 DIAGNOSIS — Z87891 Personal history of nicotine dependence: Secondary | ICD-10-CM | POA: Diagnosis not present

## 2021-07-12 DIAGNOSIS — I739 Peripheral vascular disease, unspecified: Secondary | ICD-10-CM | POA: Diagnosis not present

## 2021-07-12 DIAGNOSIS — Z9981 Dependence on supplemental oxygen: Secondary | ICD-10-CM | POA: Diagnosis not present

## 2021-07-12 DIAGNOSIS — E039 Hypothyroidism, unspecified: Secondary | ICD-10-CM | POA: Diagnosis not present

## 2021-07-12 DIAGNOSIS — I272 Pulmonary hypertension, unspecified: Secondary | ICD-10-CM | POA: Diagnosis not present

## 2021-07-12 DIAGNOSIS — Z7951 Long term (current) use of inhaled steroids: Secondary | ICD-10-CM | POA: Diagnosis not present

## 2021-07-12 DIAGNOSIS — Z9181 History of falling: Secondary | ICD-10-CM | POA: Diagnosis not present

## 2021-07-17 DIAGNOSIS — Z87891 Personal history of nicotine dependence: Secondary | ICD-10-CM | POA: Diagnosis not present

## 2021-07-17 DIAGNOSIS — I739 Peripheral vascular disease, unspecified: Secondary | ICD-10-CM | POA: Diagnosis not present

## 2021-07-17 DIAGNOSIS — Z9981 Dependence on supplemental oxygen: Secondary | ICD-10-CM | POA: Diagnosis not present

## 2021-07-17 DIAGNOSIS — J439 Emphysema, unspecified: Secondary | ICD-10-CM | POA: Diagnosis not present

## 2021-07-17 DIAGNOSIS — E039 Hypothyroidism, unspecified: Secondary | ICD-10-CM | POA: Diagnosis not present

## 2021-07-17 DIAGNOSIS — Z9181 History of falling: Secondary | ICD-10-CM | POA: Diagnosis not present

## 2021-07-17 DIAGNOSIS — Z7951 Long term (current) use of inhaled steroids: Secondary | ICD-10-CM | POA: Diagnosis not present

## 2021-07-17 DIAGNOSIS — J9611 Chronic respiratory failure with hypoxia: Secondary | ICD-10-CM | POA: Diagnosis not present

## 2021-07-17 DIAGNOSIS — I272 Pulmonary hypertension, unspecified: Secondary | ICD-10-CM | POA: Diagnosis not present

## 2021-07-17 DIAGNOSIS — I700 Atherosclerosis of aorta: Secondary | ICD-10-CM | POA: Diagnosis not present

## 2021-07-18 DIAGNOSIS — E039 Hypothyroidism, unspecified: Secondary | ICD-10-CM | POA: Diagnosis not present

## 2021-07-18 DIAGNOSIS — I700 Atherosclerosis of aorta: Secondary | ICD-10-CM | POA: Diagnosis not present

## 2021-07-18 DIAGNOSIS — Z87891 Personal history of nicotine dependence: Secondary | ICD-10-CM | POA: Diagnosis not present

## 2021-07-18 DIAGNOSIS — J439 Emphysema, unspecified: Secondary | ICD-10-CM | POA: Diagnosis not present

## 2021-07-18 DIAGNOSIS — J9611 Chronic respiratory failure with hypoxia: Secondary | ICD-10-CM | POA: Diagnosis not present

## 2021-07-18 DIAGNOSIS — I272 Pulmonary hypertension, unspecified: Secondary | ICD-10-CM | POA: Diagnosis not present

## 2021-07-18 DIAGNOSIS — Z9981 Dependence on supplemental oxygen: Secondary | ICD-10-CM | POA: Diagnosis not present

## 2021-07-18 DIAGNOSIS — Z7951 Long term (current) use of inhaled steroids: Secondary | ICD-10-CM | POA: Diagnosis not present

## 2021-07-18 DIAGNOSIS — Z9181 History of falling: Secondary | ICD-10-CM | POA: Diagnosis not present

## 2021-07-18 DIAGNOSIS — I739 Peripheral vascular disease, unspecified: Secondary | ICD-10-CM | POA: Diagnosis not present

## 2021-07-21 DIAGNOSIS — Z87891 Personal history of nicotine dependence: Secondary | ICD-10-CM | POA: Diagnosis not present

## 2021-07-21 DIAGNOSIS — I739 Peripheral vascular disease, unspecified: Secondary | ICD-10-CM | POA: Diagnosis not present

## 2021-07-21 DIAGNOSIS — N1831 Chronic kidney disease, stage 3a: Secondary | ICD-10-CM | POA: Diagnosis not present

## 2021-07-21 DIAGNOSIS — I272 Pulmonary hypertension, unspecified: Secondary | ICD-10-CM | POA: Diagnosis not present

## 2021-07-21 DIAGNOSIS — Z7951 Long term (current) use of inhaled steroids: Secondary | ICD-10-CM | POA: Diagnosis not present

## 2021-07-21 DIAGNOSIS — J440 Chronic obstructive pulmonary disease with acute lower respiratory infection: Secondary | ICD-10-CM | POA: Diagnosis not present

## 2021-07-21 DIAGNOSIS — Z9981 Dependence on supplemental oxygen: Secondary | ICD-10-CM | POA: Diagnosis not present

## 2021-07-21 DIAGNOSIS — J9611 Chronic respiratory failure with hypoxia: Secondary | ICD-10-CM | POA: Diagnosis not present

## 2021-07-21 DIAGNOSIS — I700 Atherosclerosis of aorta: Secondary | ICD-10-CM | POA: Diagnosis not present

## 2021-07-21 DIAGNOSIS — Z9181 History of falling: Secondary | ICD-10-CM | POA: Diagnosis not present

## 2021-07-21 DIAGNOSIS — J439 Emphysema, unspecified: Secondary | ICD-10-CM | POA: Diagnosis not present

## 2021-07-21 DIAGNOSIS — Z299 Encounter for prophylactic measures, unspecified: Secondary | ICD-10-CM | POA: Diagnosis not present

## 2021-07-21 DIAGNOSIS — J209 Acute bronchitis, unspecified: Secondary | ICD-10-CM | POA: Diagnosis not present

## 2021-07-21 DIAGNOSIS — E039 Hypothyroidism, unspecified: Secondary | ICD-10-CM | POA: Diagnosis not present

## 2021-07-24 DIAGNOSIS — E039 Hypothyroidism, unspecified: Secondary | ICD-10-CM | POA: Diagnosis not present

## 2021-07-24 DIAGNOSIS — I700 Atherosclerosis of aorta: Secondary | ICD-10-CM | POA: Diagnosis not present

## 2021-07-24 DIAGNOSIS — J9611 Chronic respiratory failure with hypoxia: Secondary | ICD-10-CM | POA: Diagnosis not present

## 2021-07-24 DIAGNOSIS — Z9981 Dependence on supplemental oxygen: Secondary | ICD-10-CM | POA: Diagnosis not present

## 2021-07-24 DIAGNOSIS — Z9181 History of falling: Secondary | ICD-10-CM | POA: Diagnosis not present

## 2021-07-24 DIAGNOSIS — Z7951 Long term (current) use of inhaled steroids: Secondary | ICD-10-CM | POA: Diagnosis not present

## 2021-07-24 DIAGNOSIS — I272 Pulmonary hypertension, unspecified: Secondary | ICD-10-CM | POA: Diagnosis not present

## 2021-07-24 DIAGNOSIS — J439 Emphysema, unspecified: Secondary | ICD-10-CM | POA: Diagnosis not present

## 2021-07-24 DIAGNOSIS — Z87891 Personal history of nicotine dependence: Secondary | ICD-10-CM | POA: Diagnosis not present

## 2021-07-24 DIAGNOSIS — I739 Peripheral vascular disease, unspecified: Secondary | ICD-10-CM | POA: Diagnosis not present

## 2021-07-26 DIAGNOSIS — E039 Hypothyroidism, unspecified: Secondary | ICD-10-CM | POA: Diagnosis not present

## 2021-07-26 DIAGNOSIS — J439 Emphysema, unspecified: Secondary | ICD-10-CM | POA: Diagnosis not present

## 2021-07-26 DIAGNOSIS — J9611 Chronic respiratory failure with hypoxia: Secondary | ICD-10-CM | POA: Diagnosis not present

## 2021-07-27 DIAGNOSIS — Z7951 Long term (current) use of inhaled steroids: Secondary | ICD-10-CM | POA: Diagnosis not present

## 2021-07-27 DIAGNOSIS — Z87891 Personal history of nicotine dependence: Secondary | ICD-10-CM | POA: Diagnosis not present

## 2021-07-27 DIAGNOSIS — J439 Emphysema, unspecified: Secondary | ICD-10-CM | POA: Diagnosis not present

## 2021-07-27 DIAGNOSIS — I739 Peripheral vascular disease, unspecified: Secondary | ICD-10-CM | POA: Diagnosis not present

## 2021-07-27 DIAGNOSIS — Z9181 History of falling: Secondary | ICD-10-CM | POA: Diagnosis not present

## 2021-07-27 DIAGNOSIS — J9611 Chronic respiratory failure with hypoxia: Secondary | ICD-10-CM | POA: Diagnosis not present

## 2021-07-27 DIAGNOSIS — I272 Pulmonary hypertension, unspecified: Secondary | ICD-10-CM | POA: Diagnosis not present

## 2021-07-27 DIAGNOSIS — I700 Atherosclerosis of aorta: Secondary | ICD-10-CM | POA: Diagnosis not present

## 2021-07-27 DIAGNOSIS — Z9981 Dependence on supplemental oxygen: Secondary | ICD-10-CM | POA: Diagnosis not present

## 2021-07-27 DIAGNOSIS — E039 Hypothyroidism, unspecified: Secondary | ICD-10-CM | POA: Diagnosis not present

## 2021-07-29 DIAGNOSIS — J449 Chronic obstructive pulmonary disease, unspecified: Secondary | ICD-10-CM | POA: Diagnosis not present

## 2021-08-02 DIAGNOSIS — E039 Hypothyroidism, unspecified: Secondary | ICD-10-CM | POA: Diagnosis not present

## 2021-08-02 DIAGNOSIS — I739 Peripheral vascular disease, unspecified: Secondary | ICD-10-CM | POA: Diagnosis not present

## 2021-08-02 DIAGNOSIS — J439 Emphysema, unspecified: Secondary | ICD-10-CM | POA: Diagnosis not present

## 2021-08-02 DIAGNOSIS — Z87891 Personal history of nicotine dependence: Secondary | ICD-10-CM | POA: Diagnosis not present

## 2021-08-02 DIAGNOSIS — Z9981 Dependence on supplemental oxygen: Secondary | ICD-10-CM | POA: Diagnosis not present

## 2021-08-02 DIAGNOSIS — I272 Pulmonary hypertension, unspecified: Secondary | ICD-10-CM | POA: Diagnosis not present

## 2021-08-02 DIAGNOSIS — Z7951 Long term (current) use of inhaled steroids: Secondary | ICD-10-CM | POA: Diagnosis not present

## 2021-08-02 DIAGNOSIS — J9611 Chronic respiratory failure with hypoxia: Secondary | ICD-10-CM | POA: Diagnosis not present

## 2021-08-02 DIAGNOSIS — Z9181 History of falling: Secondary | ICD-10-CM | POA: Diagnosis not present

## 2021-08-02 DIAGNOSIS — I700 Atherosclerosis of aorta: Secondary | ICD-10-CM | POA: Diagnosis not present

## 2021-08-11 DIAGNOSIS — J9611 Chronic respiratory failure with hypoxia: Secondary | ICD-10-CM | POA: Diagnosis not present

## 2021-08-11 DIAGNOSIS — Z9981 Dependence on supplemental oxygen: Secondary | ICD-10-CM | POA: Diagnosis not present

## 2021-08-11 DIAGNOSIS — I700 Atherosclerosis of aorta: Secondary | ICD-10-CM | POA: Diagnosis not present

## 2021-08-11 DIAGNOSIS — Z7951 Long term (current) use of inhaled steroids: Secondary | ICD-10-CM | POA: Diagnosis not present

## 2021-08-11 DIAGNOSIS — Z9181 History of falling: Secondary | ICD-10-CM | POA: Diagnosis not present

## 2021-08-11 DIAGNOSIS — Z87891 Personal history of nicotine dependence: Secondary | ICD-10-CM | POA: Diagnosis not present

## 2021-08-11 DIAGNOSIS — I272 Pulmonary hypertension, unspecified: Secondary | ICD-10-CM | POA: Diagnosis not present

## 2021-08-11 DIAGNOSIS — I739 Peripheral vascular disease, unspecified: Secondary | ICD-10-CM | POA: Diagnosis not present

## 2021-08-11 DIAGNOSIS — J439 Emphysema, unspecified: Secondary | ICD-10-CM | POA: Diagnosis not present

## 2021-08-11 DIAGNOSIS — E039 Hypothyroidism, unspecified: Secondary | ICD-10-CM | POA: Diagnosis not present

## 2021-08-15 DIAGNOSIS — R0602 Shortness of breath: Secondary | ICD-10-CM | POA: Diagnosis not present

## 2021-08-15 DIAGNOSIS — Z789 Other specified health status: Secondary | ICD-10-CM | POA: Diagnosis not present

## 2021-08-15 DIAGNOSIS — J9611 Chronic respiratory failure with hypoxia: Secondary | ICD-10-CM | POA: Diagnosis not present

## 2021-08-15 DIAGNOSIS — Z299 Encounter for prophylactic measures, unspecified: Secondary | ICD-10-CM | POA: Diagnosis not present

## 2021-08-19 DIAGNOSIS — J449 Chronic obstructive pulmonary disease, unspecified: Secondary | ICD-10-CM | POA: Diagnosis not present

## 2021-08-21 DIAGNOSIS — J440 Chronic obstructive pulmonary disease with acute lower respiratory infection: Secondary | ICD-10-CM | POA: Diagnosis not present

## 2021-08-28 DIAGNOSIS — J449 Chronic obstructive pulmonary disease, unspecified: Secondary | ICD-10-CM | POA: Diagnosis not present

## 2021-09-18 DIAGNOSIS — J449 Chronic obstructive pulmonary disease, unspecified: Secondary | ICD-10-CM | POA: Diagnosis not present

## 2021-09-28 DIAGNOSIS — J449 Chronic obstructive pulmonary disease, unspecified: Secondary | ICD-10-CM | POA: Diagnosis not present

## 2021-10-19 DIAGNOSIS — J449 Chronic obstructive pulmonary disease, unspecified: Secondary | ICD-10-CM | POA: Diagnosis not present

## 2021-10-28 DIAGNOSIS — J449 Chronic obstructive pulmonary disease, unspecified: Secondary | ICD-10-CM | POA: Diagnosis not present

## 2021-11-18 DIAGNOSIS — J449 Chronic obstructive pulmonary disease, unspecified: Secondary | ICD-10-CM | POA: Diagnosis not present

## 2021-11-28 DIAGNOSIS — J449 Chronic obstructive pulmonary disease, unspecified: Secondary | ICD-10-CM | POA: Diagnosis not present

## 2021-12-19 DIAGNOSIS — J449 Chronic obstructive pulmonary disease, unspecified: Secondary | ICD-10-CM | POA: Diagnosis not present

## 2021-12-29 DIAGNOSIS — J449 Chronic obstructive pulmonary disease, unspecified: Secondary | ICD-10-CM | POA: Diagnosis not present

## 2022-01-19 DIAGNOSIS — J449 Chronic obstructive pulmonary disease, unspecified: Secondary | ICD-10-CM | POA: Diagnosis not present

## 2022-01-28 DIAGNOSIS — J449 Chronic obstructive pulmonary disease, unspecified: Secondary | ICD-10-CM | POA: Diagnosis not present

## 2022-02-18 DIAGNOSIS — J449 Chronic obstructive pulmonary disease, unspecified: Secondary | ICD-10-CM | POA: Diagnosis not present

## 2022-02-28 DIAGNOSIS — J449 Chronic obstructive pulmonary disease, unspecified: Secondary | ICD-10-CM | POA: Diagnosis not present

## 2022-03-21 DIAGNOSIS — J449 Chronic obstructive pulmonary disease, unspecified: Secondary | ICD-10-CM | POA: Diagnosis not present

## 2022-03-26 DIAGNOSIS — J9691 Respiratory failure, unspecified with hypoxia: Secondary | ICD-10-CM | POA: Diagnosis not present

## 2022-03-26 DIAGNOSIS — R2689 Other abnormalities of gait and mobility: Secondary | ICD-10-CM | POA: Diagnosis not present

## 2022-03-26 DIAGNOSIS — Z20822 Contact with and (suspected) exposure to covid-19: Secondary | ICD-10-CM | POA: Diagnosis not present

## 2022-03-26 DIAGNOSIS — R0689 Other abnormalities of breathing: Secondary | ICD-10-CM | POA: Diagnosis not present

## 2022-03-26 DIAGNOSIS — I499 Cardiac arrhythmia, unspecified: Secondary | ICD-10-CM | POA: Diagnosis not present

## 2022-03-26 DIAGNOSIS — J9610 Chronic respiratory failure, unspecified whether with hypoxia or hypercapnia: Secondary | ICD-10-CM | POA: Diagnosis not present

## 2022-03-26 DIAGNOSIS — R2681 Unsteadiness on feet: Secondary | ICD-10-CM | POA: Diagnosis not present

## 2022-03-26 DIAGNOSIS — R0902 Hypoxemia: Secondary | ICD-10-CM | POA: Diagnosis not present

## 2022-03-26 DIAGNOSIS — R7989 Other specified abnormal findings of blood chemistry: Secondary | ICD-10-CM | POA: Diagnosis not present

## 2022-03-26 DIAGNOSIS — J441 Chronic obstructive pulmonary disease with (acute) exacerbation: Secondary | ICD-10-CM | POA: Diagnosis not present

## 2022-03-26 DIAGNOSIS — Z743 Need for continuous supervision: Secondary | ICD-10-CM | POA: Diagnosis not present

## 2022-03-26 DIAGNOSIS — J9611 Chronic respiratory failure with hypoxia: Secondary | ICD-10-CM | POA: Diagnosis not present

## 2022-03-26 DIAGNOSIS — Z7952 Long term (current) use of systemic steroids: Secondary | ICD-10-CM | POA: Diagnosis not present

## 2022-03-26 DIAGNOSIS — J449 Chronic obstructive pulmonary disease, unspecified: Secondary | ICD-10-CM | POA: Diagnosis not present

## 2022-03-26 DIAGNOSIS — M6281 Muscle weakness (generalized): Secondary | ICD-10-CM | POA: Diagnosis not present

## 2022-03-26 DIAGNOSIS — R030 Elevated blood-pressure reading, without diagnosis of hypertension: Secondary | ICD-10-CM | POA: Diagnosis not present

## 2022-03-26 DIAGNOSIS — R7881 Bacteremia: Secondary | ICD-10-CM | POA: Diagnosis not present

## 2022-03-26 DIAGNOSIS — J9692 Respiratory failure, unspecified with hypercapnia: Secondary | ICD-10-CM | POA: Diagnosis not present

## 2022-03-26 DIAGNOSIS — J9602 Acute respiratory failure with hypercapnia: Secondary | ICD-10-CM | POA: Diagnosis not present

## 2022-03-26 DIAGNOSIS — Z9981 Dependence on supplemental oxygen: Secondary | ICD-10-CM | POA: Diagnosis not present

## 2022-03-26 DIAGNOSIS — R64 Cachexia: Secondary | ICD-10-CM | POA: Diagnosis not present

## 2022-03-26 DIAGNOSIS — Z792 Long term (current) use of antibiotics: Secondary | ICD-10-CM | POA: Diagnosis not present

## 2022-03-26 DIAGNOSIS — R109 Unspecified abdominal pain: Secondary | ICD-10-CM | POA: Diagnosis not present

## 2022-03-26 DIAGNOSIS — Z79899 Other long term (current) drug therapy: Secondary | ICD-10-CM | POA: Diagnosis not present

## 2022-03-26 DIAGNOSIS — R0602 Shortness of breath: Secondary | ICD-10-CM | POA: Diagnosis not present

## 2022-03-26 DIAGNOSIS — R791 Abnormal coagulation profile: Secondary | ICD-10-CM | POA: Diagnosis not present

## 2022-03-26 DIAGNOSIS — I10 Essential (primary) hypertension: Secondary | ICD-10-CM | POA: Diagnosis not present

## 2022-03-26 DIAGNOSIS — B957 Other staphylococcus as the cause of diseases classified elsewhere: Secondary | ICD-10-CM | POA: Diagnosis not present

## 2022-03-26 DIAGNOSIS — R5381 Other malaise: Secondary | ICD-10-CM | POA: Diagnosis not present

## 2022-03-26 DIAGNOSIS — E039 Hypothyroidism, unspecified: Secondary | ICD-10-CM | POA: Diagnosis not present

## 2022-03-26 DIAGNOSIS — R54 Age-related physical debility: Secondary | ICD-10-CM | POA: Diagnosis not present

## 2022-04-06 DIAGNOSIS — J441 Chronic obstructive pulmonary disease with (acute) exacerbation: Secondary | ICD-10-CM | POA: Diagnosis not present

## 2022-04-06 DIAGNOSIS — E039 Hypothyroidism, unspecified: Secondary | ICD-10-CM | POA: Diagnosis not present

## 2022-04-06 DIAGNOSIS — J9611 Chronic respiratory failure with hypoxia: Secondary | ICD-10-CM | POA: Diagnosis not present

## 2022-04-06 DIAGNOSIS — R2689 Other abnormalities of gait and mobility: Secondary | ICD-10-CM | POA: Diagnosis not present

## 2022-04-06 DIAGNOSIS — I700 Atherosclerosis of aorta: Secondary | ICD-10-CM | POA: Diagnosis not present

## 2022-04-06 DIAGNOSIS — Z299 Encounter for prophylactic measures, unspecified: Secondary | ICD-10-CM | POA: Diagnosis not present

## 2022-04-06 DIAGNOSIS — M6281 Muscle weakness (generalized): Secondary | ICD-10-CM | POA: Diagnosis not present

## 2022-04-06 DIAGNOSIS — N1831 Chronic kidney disease, stage 3a: Secondary | ICD-10-CM | POA: Diagnosis not present

## 2022-04-06 DIAGNOSIS — R2681 Unsteadiness on feet: Secondary | ICD-10-CM | POA: Diagnosis not present

## 2022-04-06 DIAGNOSIS — D692 Other nonthrombocytopenic purpura: Secondary | ICD-10-CM | POA: Diagnosis not present

## 2022-04-06 DIAGNOSIS — J449 Chronic obstructive pulmonary disease, unspecified: Secondary | ICD-10-CM | POA: Diagnosis not present

## 2022-04-10 DIAGNOSIS — I700 Atherosclerosis of aorta: Secondary | ICD-10-CM | POA: Diagnosis not present

## 2022-04-10 DIAGNOSIS — Z299 Encounter for prophylactic measures, unspecified: Secondary | ICD-10-CM | POA: Diagnosis not present

## 2022-04-10 DIAGNOSIS — N1831 Chronic kidney disease, stage 3a: Secondary | ICD-10-CM | POA: Diagnosis not present

## 2022-04-10 DIAGNOSIS — D692 Other nonthrombocytopenic purpura: Secondary | ICD-10-CM | POA: Diagnosis not present

## 2022-04-10 DIAGNOSIS — J441 Chronic obstructive pulmonary disease with (acute) exacerbation: Secondary | ICD-10-CM | POA: Diagnosis not present

## 2022-04-17 DIAGNOSIS — J441 Chronic obstructive pulmonary disease with (acute) exacerbation: Secondary | ICD-10-CM | POA: Diagnosis not present

## 2022-04-20 DIAGNOSIS — J449 Chronic obstructive pulmonary disease, unspecified: Secondary | ICD-10-CM | POA: Diagnosis not present

## 2022-04-21 DIAGNOSIS — J441 Chronic obstructive pulmonary disease with (acute) exacerbation: Secondary | ICD-10-CM | POA: Diagnosis not present

## 2022-04-21 DIAGNOSIS — J9611 Chronic respiratory failure with hypoxia: Secondary | ICD-10-CM | POA: Diagnosis not present

## 2022-04-26 DIAGNOSIS — Z9181 History of falling: Secondary | ICD-10-CM | POA: Diagnosis not present

## 2022-04-26 DIAGNOSIS — Z7951 Long term (current) use of inhaled steroids: Secondary | ICD-10-CM | POA: Diagnosis not present

## 2022-04-26 DIAGNOSIS — Z9981 Dependence on supplemental oxygen: Secondary | ICD-10-CM | POA: Diagnosis not present

## 2022-04-26 DIAGNOSIS — J9611 Chronic respiratory failure with hypoxia: Secondary | ICD-10-CM | POA: Diagnosis not present

## 2022-04-26 DIAGNOSIS — G319 Degenerative disease of nervous system, unspecified: Secondary | ICD-10-CM | POA: Diagnosis not present

## 2022-04-26 DIAGNOSIS — J441 Chronic obstructive pulmonary disease with (acute) exacerbation: Secondary | ICD-10-CM | POA: Diagnosis not present

## 2022-04-26 DIAGNOSIS — D692 Other nonthrombocytopenic purpura: Secondary | ICD-10-CM | POA: Diagnosis not present

## 2022-04-26 DIAGNOSIS — I129 Hypertensive chronic kidney disease with stage 1 through stage 4 chronic kidney disease, or unspecified chronic kidney disease: Secondary | ICD-10-CM | POA: Diagnosis not present

## 2022-04-26 DIAGNOSIS — I700 Atherosclerosis of aorta: Secondary | ICD-10-CM | POA: Diagnosis not present

## 2022-04-26 DIAGNOSIS — N1831 Chronic kidney disease, stage 3a: Secondary | ICD-10-CM | POA: Diagnosis not present

## 2022-04-26 DIAGNOSIS — Z993 Dependence on wheelchair: Secondary | ICD-10-CM | POA: Diagnosis not present

## 2022-04-30 DIAGNOSIS — J449 Chronic obstructive pulmonary disease, unspecified: Secondary | ICD-10-CM | POA: Diagnosis not present

## 2022-05-02 DIAGNOSIS — G319 Degenerative disease of nervous system, unspecified: Secondary | ICD-10-CM | POA: Diagnosis not present

## 2022-05-02 DIAGNOSIS — Z9181 History of falling: Secondary | ICD-10-CM | POA: Diagnosis not present

## 2022-05-02 DIAGNOSIS — Z7951 Long term (current) use of inhaled steroids: Secondary | ICD-10-CM | POA: Diagnosis not present

## 2022-05-02 DIAGNOSIS — I700 Atherosclerosis of aorta: Secondary | ICD-10-CM | POA: Diagnosis not present

## 2022-05-02 DIAGNOSIS — Z993 Dependence on wheelchair: Secondary | ICD-10-CM | POA: Diagnosis not present

## 2022-05-02 DIAGNOSIS — J9611 Chronic respiratory failure with hypoxia: Secondary | ICD-10-CM | POA: Diagnosis not present

## 2022-05-02 DIAGNOSIS — Z9981 Dependence on supplemental oxygen: Secondary | ICD-10-CM | POA: Diagnosis not present

## 2022-05-02 DIAGNOSIS — D692 Other nonthrombocytopenic purpura: Secondary | ICD-10-CM | POA: Diagnosis not present

## 2022-05-02 DIAGNOSIS — N1831 Chronic kidney disease, stage 3a: Secondary | ICD-10-CM | POA: Diagnosis not present

## 2022-05-02 DIAGNOSIS — I129 Hypertensive chronic kidney disease with stage 1 through stage 4 chronic kidney disease, or unspecified chronic kidney disease: Secondary | ICD-10-CM | POA: Diagnosis not present

## 2022-05-02 DIAGNOSIS — J441 Chronic obstructive pulmonary disease with (acute) exacerbation: Secondary | ICD-10-CM | POA: Diagnosis not present

## 2022-05-03 DIAGNOSIS — D692 Other nonthrombocytopenic purpura: Secondary | ICD-10-CM | POA: Diagnosis not present

## 2022-05-03 DIAGNOSIS — G319 Degenerative disease of nervous system, unspecified: Secondary | ICD-10-CM | POA: Diagnosis not present

## 2022-05-03 DIAGNOSIS — Z993 Dependence on wheelchair: Secondary | ICD-10-CM | POA: Diagnosis not present

## 2022-05-03 DIAGNOSIS — Z7951 Long term (current) use of inhaled steroids: Secondary | ICD-10-CM | POA: Diagnosis not present

## 2022-05-03 DIAGNOSIS — J441 Chronic obstructive pulmonary disease with (acute) exacerbation: Secondary | ICD-10-CM | POA: Diagnosis not present

## 2022-05-03 DIAGNOSIS — Z9181 History of falling: Secondary | ICD-10-CM | POA: Diagnosis not present

## 2022-05-03 DIAGNOSIS — I10 Essential (primary) hypertension: Secondary | ICD-10-CM | POA: Diagnosis not present

## 2022-05-03 DIAGNOSIS — Z09 Encounter for follow-up examination after completed treatment for conditions other than malignant neoplasm: Secondary | ICD-10-CM | POA: Diagnosis not present

## 2022-05-03 DIAGNOSIS — J9611 Chronic respiratory failure with hypoxia: Secondary | ICD-10-CM | POA: Diagnosis not present

## 2022-05-03 DIAGNOSIS — N1831 Chronic kidney disease, stage 3a: Secondary | ICD-10-CM | POA: Diagnosis not present

## 2022-05-03 DIAGNOSIS — I700 Atherosclerosis of aorta: Secondary | ICD-10-CM | POA: Diagnosis not present

## 2022-05-03 DIAGNOSIS — J449 Chronic obstructive pulmonary disease, unspecified: Secondary | ICD-10-CM | POA: Diagnosis not present

## 2022-05-03 DIAGNOSIS — I129 Hypertensive chronic kidney disease with stage 1 through stage 4 chronic kidney disease, or unspecified chronic kidney disease: Secondary | ICD-10-CM | POA: Diagnosis not present

## 2022-05-03 DIAGNOSIS — Z9981 Dependence on supplemental oxygen: Secondary | ICD-10-CM | POA: Diagnosis not present

## 2022-05-07 DIAGNOSIS — Z743 Need for continuous supervision: Secondary | ICD-10-CM | POA: Diagnosis not present

## 2022-05-07 DIAGNOSIS — R0602 Shortness of breath: Secondary | ICD-10-CM | POA: Diagnosis not present

## 2022-05-07 DIAGNOSIS — R6889 Other general symptoms and signs: Secondary | ICD-10-CM | POA: Diagnosis not present

## 2022-05-09 DIAGNOSIS — Z7951 Long term (current) use of inhaled steroids: Secondary | ICD-10-CM | POA: Diagnosis not present

## 2022-05-09 DIAGNOSIS — D692 Other nonthrombocytopenic purpura: Secondary | ICD-10-CM | POA: Diagnosis not present

## 2022-05-09 DIAGNOSIS — I700 Atherosclerosis of aorta: Secondary | ICD-10-CM | POA: Diagnosis not present

## 2022-05-09 DIAGNOSIS — J9611 Chronic respiratory failure with hypoxia: Secondary | ICD-10-CM | POA: Diagnosis not present

## 2022-05-09 DIAGNOSIS — Z9181 History of falling: Secondary | ICD-10-CM | POA: Diagnosis not present

## 2022-05-09 DIAGNOSIS — G319 Degenerative disease of nervous system, unspecified: Secondary | ICD-10-CM | POA: Diagnosis not present

## 2022-05-09 DIAGNOSIS — Z993 Dependence on wheelchair: Secondary | ICD-10-CM | POA: Diagnosis not present

## 2022-05-09 DIAGNOSIS — N1831 Chronic kidney disease, stage 3a: Secondary | ICD-10-CM | POA: Diagnosis not present

## 2022-05-09 DIAGNOSIS — J441 Chronic obstructive pulmonary disease with (acute) exacerbation: Secondary | ICD-10-CM | POA: Diagnosis not present

## 2022-05-09 DIAGNOSIS — I129 Hypertensive chronic kidney disease with stage 1 through stage 4 chronic kidney disease, or unspecified chronic kidney disease: Secondary | ICD-10-CM | POA: Diagnosis not present

## 2022-05-09 DIAGNOSIS — Z9981 Dependence on supplemental oxygen: Secondary | ICD-10-CM | POA: Diagnosis not present

## 2022-05-10 DIAGNOSIS — D649 Anemia, unspecified: Secondary | ICD-10-CM | POA: Diagnosis not present

## 2022-05-10 DIAGNOSIS — R069 Unspecified abnormalities of breathing: Secondary | ICD-10-CM | POA: Diagnosis not present

## 2022-05-10 DIAGNOSIS — J9621 Acute and chronic respiratory failure with hypoxia: Secondary | ICD-10-CM | POA: Diagnosis not present

## 2022-05-10 DIAGNOSIS — Z9981 Dependence on supplemental oxygen: Secondary | ICD-10-CM | POA: Diagnosis not present

## 2022-05-10 DIAGNOSIS — E440 Moderate protein-calorie malnutrition: Secondary | ICD-10-CM | POA: Diagnosis not present

## 2022-05-10 DIAGNOSIS — A419 Sepsis, unspecified organism: Secondary | ICD-10-CM | POA: Diagnosis not present

## 2022-05-10 DIAGNOSIS — Z9989 Dependence on other enabling machines and devices: Secondary | ICD-10-CM | POA: Diagnosis not present

## 2022-05-10 DIAGNOSIS — Z66 Do not resuscitate: Secondary | ICD-10-CM | POA: Diagnosis not present

## 2022-05-10 DIAGNOSIS — I499 Cardiac arrhythmia, unspecified: Secondary | ICD-10-CM | POA: Diagnosis not present

## 2022-05-10 DIAGNOSIS — Z20822 Contact with and (suspected) exposure to covid-19: Secondary | ICD-10-CM | POA: Diagnosis not present

## 2022-05-10 DIAGNOSIS — Z79899 Other long term (current) drug therapy: Secondary | ICD-10-CM | POA: Diagnosis not present

## 2022-05-10 DIAGNOSIS — E039 Hypothyroidism, unspecified: Secondary | ICD-10-CM | POA: Diagnosis not present

## 2022-05-10 DIAGNOSIS — Z7951 Long term (current) use of inhaled steroids: Secondary | ICD-10-CM | POA: Diagnosis not present

## 2022-05-10 DIAGNOSIS — R54 Age-related physical debility: Secondary | ICD-10-CM | POA: Diagnosis not present

## 2022-05-10 DIAGNOSIS — E870 Hyperosmolality and hypernatremia: Secondary | ICD-10-CM | POA: Diagnosis not present

## 2022-05-10 DIAGNOSIS — J441 Chronic obstructive pulmonary disease with (acute) exacerbation: Secondary | ICD-10-CM | POA: Diagnosis not present

## 2022-05-10 DIAGNOSIS — Z743 Need for continuous supervision: Secondary | ICD-10-CM | POA: Diagnosis not present

## 2022-05-10 DIAGNOSIS — J439 Emphysema, unspecified: Secondary | ICD-10-CM | POA: Diagnosis not present

## 2022-05-10 DIAGNOSIS — R0682 Tachypnea, not elsewhere classified: Secondary | ICD-10-CM | POA: Diagnosis not present

## 2022-05-10 DIAGNOSIS — J449 Chronic obstructive pulmonary disease, unspecified: Secondary | ICD-10-CM | POA: Diagnosis not present

## 2022-05-10 DIAGNOSIS — J440 Chronic obstructive pulmonary disease with acute lower respiratory infection: Secondary | ICD-10-CM | POA: Diagnosis not present

## 2022-05-10 DIAGNOSIS — R0602 Shortness of breath: Secondary | ICD-10-CM | POA: Diagnosis not present

## 2022-05-10 DIAGNOSIS — I10 Essential (primary) hypertension: Secondary | ICD-10-CM | POA: Diagnosis not present

## 2022-05-10 DIAGNOSIS — J9622 Acute and chronic respiratory failure with hypercapnia: Secondary | ICD-10-CM | POA: Diagnosis not present

## 2022-05-10 DIAGNOSIS — R0902 Hypoxemia: Secondary | ICD-10-CM | POA: Diagnosis not present

## 2022-05-11 DIAGNOSIS — J9622 Acute and chronic respiratory failure with hypercapnia: Secondary | ICD-10-CM | POA: Diagnosis not present

## 2022-05-11 DIAGNOSIS — E870 Hyperosmolality and hypernatremia: Secondary | ICD-10-CM | POA: Diagnosis not present

## 2022-05-11 DIAGNOSIS — J9621 Acute and chronic respiratory failure with hypoxia: Secondary | ICD-10-CM | POA: Diagnosis not present

## 2022-05-11 DIAGNOSIS — D649 Anemia, unspecified: Secondary | ICD-10-CM | POA: Diagnosis not present

## 2022-05-11 DIAGNOSIS — E039 Hypothyroidism, unspecified: Secondary | ICD-10-CM | POA: Diagnosis not present

## 2022-05-11 DIAGNOSIS — J441 Chronic obstructive pulmonary disease with (acute) exacerbation: Secondary | ICD-10-CM | POA: Diagnosis not present

## 2022-05-12 DIAGNOSIS — D649 Anemia, unspecified: Secondary | ICD-10-CM | POA: Diagnosis not present

## 2022-05-12 DIAGNOSIS — J9622 Acute and chronic respiratory failure with hypercapnia: Secondary | ICD-10-CM | POA: Diagnosis not present

## 2022-05-12 DIAGNOSIS — J9621 Acute and chronic respiratory failure with hypoxia: Secondary | ICD-10-CM | POA: Diagnosis not present

## 2022-05-12 DIAGNOSIS — J441 Chronic obstructive pulmonary disease with (acute) exacerbation: Secondary | ICD-10-CM | POA: Diagnosis not present

## 2022-05-12 DIAGNOSIS — E039 Hypothyroidism, unspecified: Secondary | ICD-10-CM | POA: Diagnosis not present

## 2022-05-12 DIAGNOSIS — E870 Hyperosmolality and hypernatremia: Secondary | ICD-10-CM | POA: Diagnosis not present

## 2022-05-13 DIAGNOSIS — J9622 Acute and chronic respiratory failure with hypercapnia: Secondary | ICD-10-CM | POA: Diagnosis not present

## 2022-05-13 DIAGNOSIS — J9621 Acute and chronic respiratory failure with hypoxia: Secondary | ICD-10-CM | POA: Diagnosis not present

## 2022-05-13 DIAGNOSIS — E870 Hyperosmolality and hypernatremia: Secondary | ICD-10-CM | POA: Diagnosis not present

## 2022-05-13 DIAGNOSIS — J441 Chronic obstructive pulmonary disease with (acute) exacerbation: Secondary | ICD-10-CM | POA: Diagnosis not present

## 2022-05-13 DIAGNOSIS — D649 Anemia, unspecified: Secondary | ICD-10-CM | POA: Diagnosis not present

## 2022-05-13 DIAGNOSIS — E039 Hypothyroidism, unspecified: Secondary | ICD-10-CM | POA: Diagnosis not present

## 2022-05-14 DIAGNOSIS — J449 Chronic obstructive pulmonary disease, unspecified: Secondary | ICD-10-CM | POA: Diagnosis not present

## 2022-05-14 DIAGNOSIS — D649 Anemia, unspecified: Secondary | ICD-10-CM | POA: Diagnosis not present

## 2022-05-14 DIAGNOSIS — J9622 Acute and chronic respiratory failure with hypercapnia: Secondary | ICD-10-CM | POA: Diagnosis not present

## 2022-05-14 DIAGNOSIS — E870 Hyperosmolality and hypernatremia: Secondary | ICD-10-CM | POA: Diagnosis not present

## 2022-05-14 DIAGNOSIS — E039 Hypothyroidism, unspecified: Secondary | ICD-10-CM | POA: Diagnosis not present

## 2022-05-14 DIAGNOSIS — Z9989 Dependence on other enabling machines and devices: Secondary | ICD-10-CM | POA: Diagnosis not present

## 2022-05-14 DIAGNOSIS — J9621 Acute and chronic respiratory failure with hypoxia: Secondary | ICD-10-CM | POA: Diagnosis not present

## 2022-05-14 DIAGNOSIS — Z66 Do not resuscitate: Secondary | ICD-10-CM | POA: Diagnosis not present

## 2022-05-15 DIAGNOSIS — E440 Moderate protein-calorie malnutrition: Secondary | ICD-10-CM | POA: Diagnosis not present

## 2022-05-15 DIAGNOSIS — J9622 Acute and chronic respiratory failure with hypercapnia: Secondary | ICD-10-CM | POA: Diagnosis not present

## 2022-05-15 DIAGNOSIS — E039 Hypothyroidism, unspecified: Secondary | ICD-10-CM | POA: Diagnosis not present

## 2022-05-15 DIAGNOSIS — Z9989 Dependence on other enabling machines and devices: Secondary | ICD-10-CM | POA: Diagnosis not present

## 2022-05-15 DIAGNOSIS — J449 Chronic obstructive pulmonary disease, unspecified: Secondary | ICD-10-CM | POA: Diagnosis not present

## 2022-05-15 DIAGNOSIS — Z79899 Other long term (current) drug therapy: Secondary | ICD-10-CM | POA: Diagnosis not present

## 2022-05-15 DIAGNOSIS — E870 Hyperosmolality and hypernatremia: Secondary | ICD-10-CM | POA: Diagnosis not present

## 2022-05-15 DIAGNOSIS — J9621 Acute and chronic respiratory failure with hypoxia: Secondary | ICD-10-CM | POA: Diagnosis not present

## 2022-05-15 DIAGNOSIS — I10 Essential (primary) hypertension: Secondary | ICD-10-CM | POA: Diagnosis not present

## 2022-05-16 DIAGNOSIS — E440 Moderate protein-calorie malnutrition: Secondary | ICD-10-CM | POA: Diagnosis not present

## 2022-05-16 DIAGNOSIS — Z9981 Dependence on supplemental oxygen: Secondary | ICD-10-CM | POA: Diagnosis not present

## 2022-05-16 DIAGNOSIS — J9621 Acute and chronic respiratory failure with hypoxia: Secondary | ICD-10-CM | POA: Diagnosis not present

## 2022-05-16 DIAGNOSIS — J441 Chronic obstructive pulmonary disease with (acute) exacerbation: Secondary | ICD-10-CM | POA: Diagnosis not present

## 2022-05-16 DIAGNOSIS — D649 Anemia, unspecified: Secondary | ICD-10-CM | POA: Diagnosis not present

## 2022-05-16 DIAGNOSIS — E039 Hypothyroidism, unspecified: Secondary | ICD-10-CM | POA: Diagnosis not present

## 2022-05-16 DIAGNOSIS — J9622 Acute and chronic respiratory failure with hypercapnia: Secondary | ICD-10-CM | POA: Diagnosis not present

## 2022-05-16 DIAGNOSIS — I10 Essential (primary) hypertension: Secondary | ICD-10-CM | POA: Diagnosis not present

## 2022-05-21 DIAGNOSIS — J449 Chronic obstructive pulmonary disease, unspecified: Secondary | ICD-10-CM | POA: Diagnosis not present

## 2022-05-24 DEATH — deceased

## 2023-08-21 IMAGING — DX DG CHEST 1V PORT
2 series · 2 of 2 positions shown · non-contrast
Comparison: 07/06/2019

CLINICAL DATA: Shortness of breath.

EXAM:
PORTABLE CHEST 1 VIEW

[chest ap (1 of 2)]
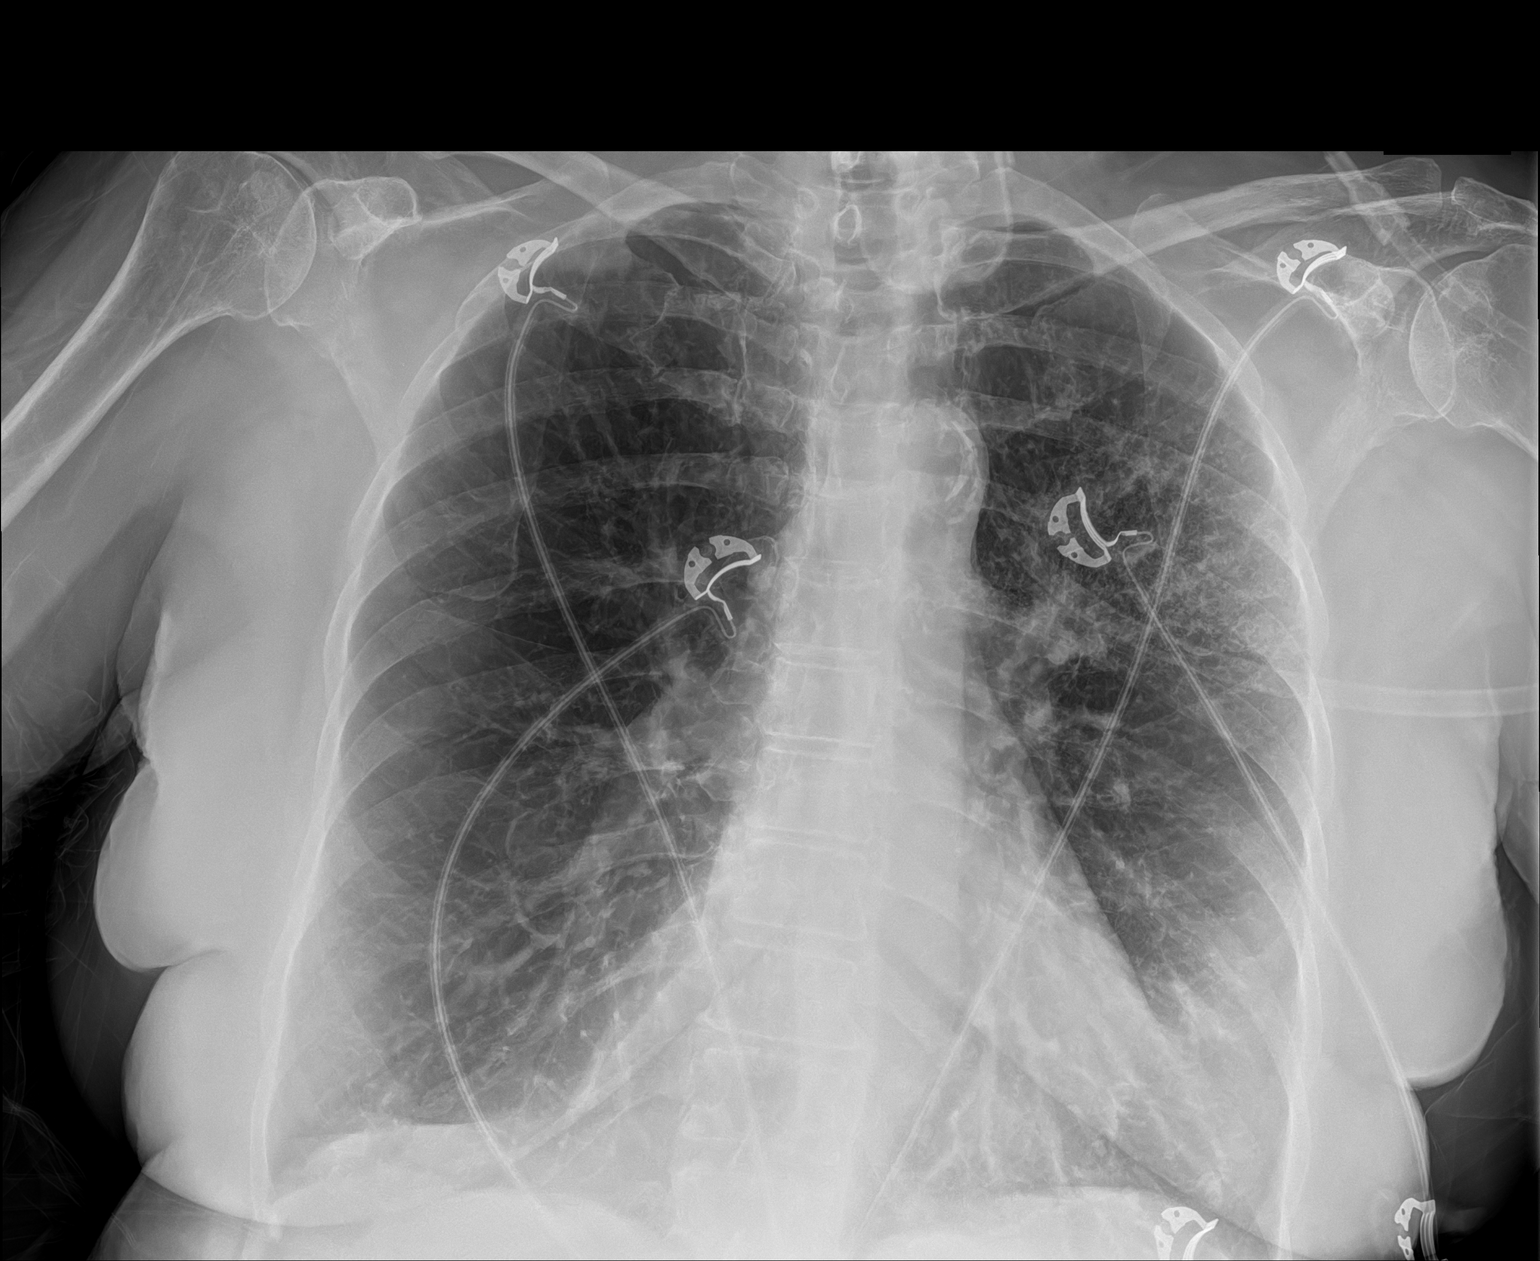

[chest ap (2 of 2)]
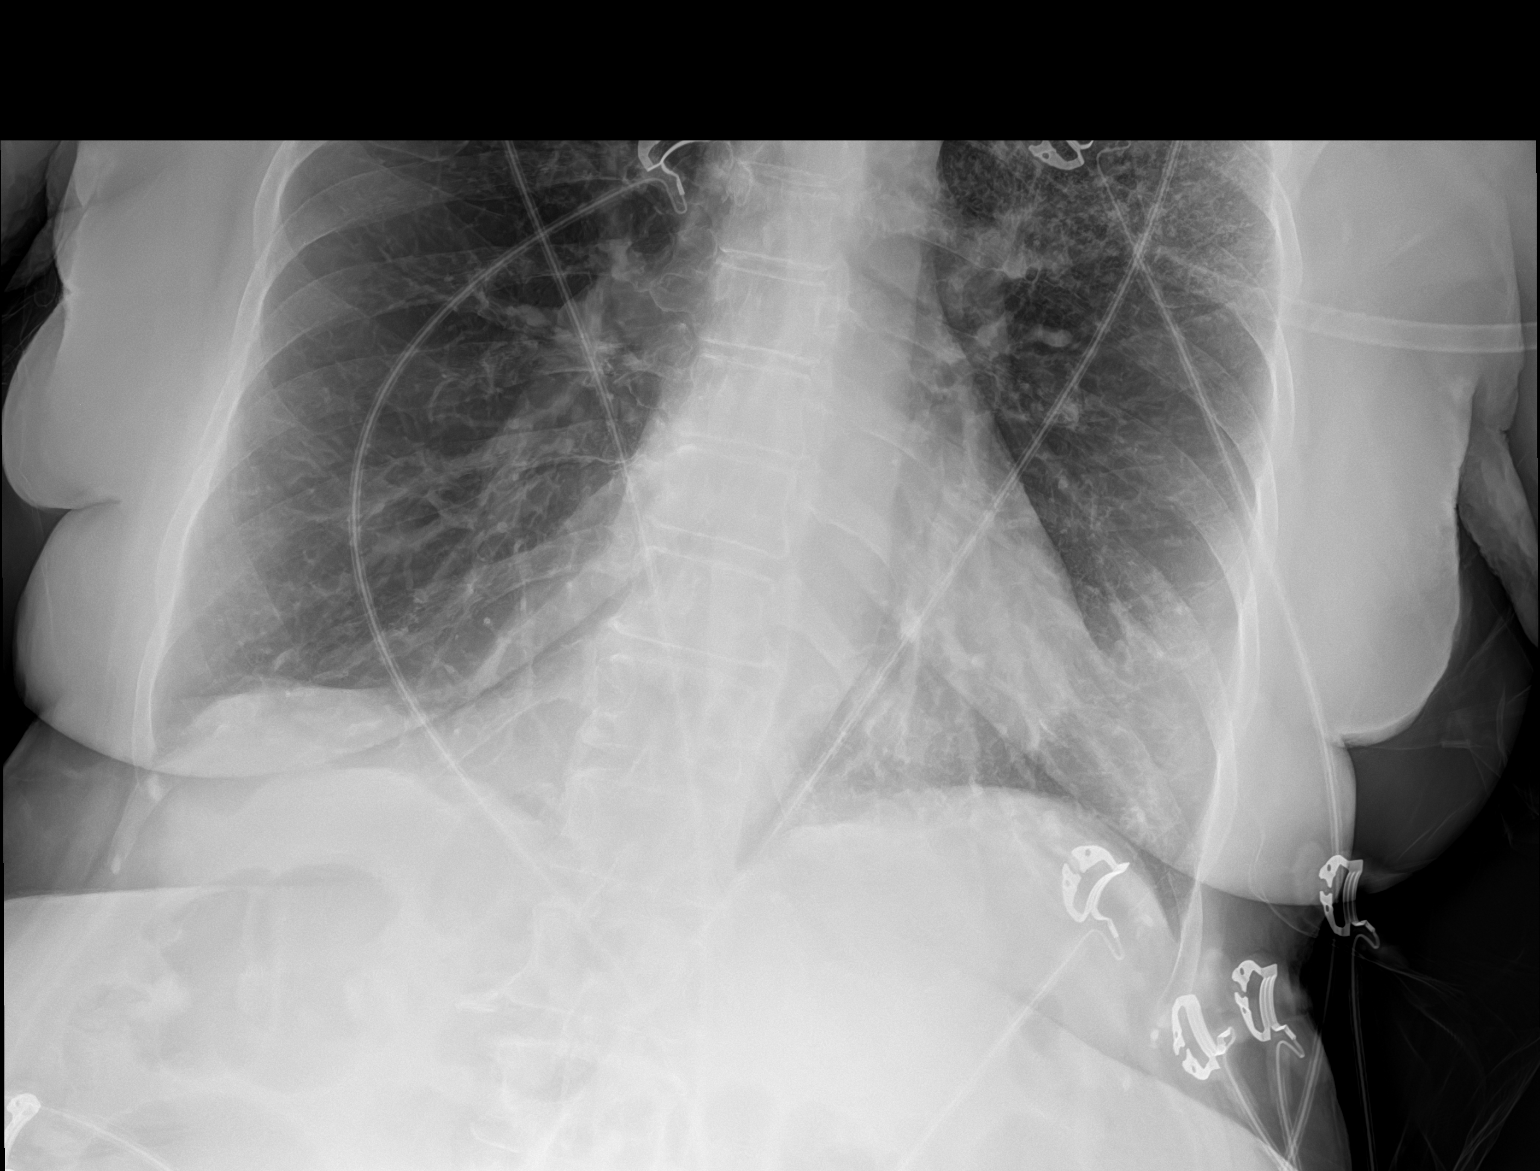

[2 of 2 positions shown; findings below may reference images not displayed]

FINDINGS: Cardiac silhouette and mediastinal contours are within normal limits
with moderate calcification within the aortic arch. Moderate
lucencies within the upper lungs suggesting chronic emphysematous
changes, similar to prior. There is flattening of the diaphragms and
moderate to high-grade hyperinflation unchanged. New subtle
heterogeneous airspace opacification within the superior left lung.
The right lung is clear. No pleural effusion is seen. No
pneumothorax. No acute skeletal abnormality. Likely cholecystectomy
clips.
IMPRESSION: New mild heterogeneous opacity within the superolateral left lung
suspicious for pneumonia. Recommend follow-up radiographs 6 weeks
after treatment to ensure resolution.

Chronic hyperinflation and emphysema.
